# Patient Record
Sex: Female | Born: 1985 | Race: Black or African American | Hispanic: No
Health system: Southern US, Community
[De-identification: ages and names within clinical notes are randomized; demographics above are authoritative.]

## PROBLEM LIST (undated history)

## (undated) ENCOUNTER — Inpatient Hospital Stay (HOSPITAL_COMMUNITY): Payer: Self-pay

## (undated) DIAGNOSIS — J45909 Unspecified asthma, uncomplicated: Secondary | ICD-10-CM

## (undated) DIAGNOSIS — O24419 Gestational diabetes mellitus in pregnancy, unspecified control: Secondary | ICD-10-CM

## (undated) HISTORY — PX: BARTHOLIN GLAND CYST EXCISION: SHX565

---
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
## 2005-03-10 ENCOUNTER — Emergency Department (HOSPITAL_COMMUNITY): Admission: EM | Admit: 2005-03-10 | Discharge: 2005-03-10 | Payer: Self-pay | Admitting: Emergency Medicine

## 2005-07-23 ENCOUNTER — Emergency Department (HOSPITAL_COMMUNITY): Admission: EM | Admit: 2005-07-23 | Discharge: 2005-07-23 | Payer: Self-pay | Admitting: Emergency Medicine

## 2006-02-08 ENCOUNTER — Inpatient Hospital Stay (HOSPITAL_COMMUNITY): Admission: AD | Admit: 2006-02-08 | Discharge: 2006-02-08 | Payer: Self-pay | Admitting: Obstetrics & Gynecology

## 2006-04-15 ENCOUNTER — Inpatient Hospital Stay (HOSPITAL_COMMUNITY): Admission: AD | Admit: 2006-04-15 | Discharge: 2006-04-15 | Payer: Self-pay | Admitting: Obstetrics & Gynecology

## 2006-04-15 ENCOUNTER — Ambulatory Visit: Payer: Self-pay | Admitting: Obstetrics & Gynecology

## 2006-07-06 ENCOUNTER — Ambulatory Visit: Payer: Self-pay | Admitting: Obstetrics and Gynecology

## 2006-07-06 ENCOUNTER — Inpatient Hospital Stay (HOSPITAL_COMMUNITY): Admission: AD | Admit: 2006-07-06 | Discharge: 2006-07-06 | Payer: Self-pay | Admitting: Obstetrics and Gynecology

## 2006-07-19 ENCOUNTER — Inpatient Hospital Stay (HOSPITAL_COMMUNITY): Admission: AD | Admit: 2006-07-19 | Discharge: 2006-07-19 | Payer: Self-pay | Admitting: Obstetrics and Gynecology

## 2006-07-21 ENCOUNTER — Inpatient Hospital Stay (HOSPITAL_COMMUNITY): Admission: AD | Admit: 2006-07-21 | Discharge: 2006-07-24 | Payer: Self-pay | Admitting: Obstetrics and Gynecology

## 2006-07-25 ENCOUNTER — Inpatient Hospital Stay (HOSPITAL_COMMUNITY): Admission: AD | Admit: 2006-07-25 | Discharge: 2006-07-25 | Payer: Self-pay | Admitting: Obstetrics and Gynecology

## 2006-09-23 ENCOUNTER — Inpatient Hospital Stay (HOSPITAL_COMMUNITY): Admission: EM | Admit: 2006-09-23 | Discharge: 2006-09-27 | Payer: Self-pay | Admitting: Emergency Medicine

## 2007-10-30 ENCOUNTER — Emergency Department (HOSPITAL_COMMUNITY): Admission: EM | Admit: 2007-10-30 | Discharge: 2007-10-31 | Payer: Self-pay | Admitting: Emergency Medicine

## 2007-11-19 ENCOUNTER — Emergency Department (HOSPITAL_COMMUNITY): Admission: EM | Admit: 2007-11-19 | Discharge: 2007-11-19 | Payer: Self-pay | Admitting: Emergency Medicine

## 2008-06-08 ENCOUNTER — Ambulatory Visit: Payer: Self-pay | Admitting: Physician Assistant

## 2008-06-08 ENCOUNTER — Inpatient Hospital Stay (HOSPITAL_COMMUNITY): Admission: AD | Admit: 2008-06-08 | Discharge: 2008-06-09 | Payer: Self-pay | Admitting: Obstetrics & Gynecology

## 2008-11-02 ENCOUNTER — Inpatient Hospital Stay (HOSPITAL_COMMUNITY): Admission: AD | Admit: 2008-11-02 | Discharge: 2008-11-02 | Payer: Self-pay | Admitting: Obstetrics and Gynecology

## 2009-01-01 ENCOUNTER — Inpatient Hospital Stay (HOSPITAL_COMMUNITY): Admission: AD | Admit: 2009-01-01 | Discharge: 2009-01-01 | Payer: Self-pay | Admitting: Obstetrics and Gynecology

## 2009-01-28 ENCOUNTER — Inpatient Hospital Stay (HOSPITAL_COMMUNITY): Admission: AD | Admit: 2009-01-28 | Discharge: 2009-01-28 | Payer: Self-pay | Admitting: Obstetrics and Gynecology

## 2009-01-29 ENCOUNTER — Inpatient Hospital Stay (HOSPITAL_COMMUNITY): Admission: AD | Admit: 2009-01-29 | Discharge: 2009-02-01 | Payer: Self-pay | Admitting: Obstetrics and Gynecology

## 2009-02-02 ENCOUNTER — Encounter: Admission: RE | Admit: 2009-02-02 | Discharge: 2009-02-06 | Payer: Self-pay | Admitting: Obstetrics and Gynecology

## 2010-02-05 ENCOUNTER — Emergency Department (HOSPITAL_COMMUNITY)
Admission: EM | Admit: 2010-02-05 | Discharge: 2010-02-05 | Payer: Self-pay | Source: Home / Self Care | Admitting: Emergency Medicine

## 2010-02-05 LAB — CBC
HCT: 42.3 % (ref 36.0–46.0)
Hemoglobin: 14.5 g/dL (ref 12.0–15.0)
MCV: 88.3 fL (ref 78.0–100.0)
RBC: 4.79 MIL/uL (ref 3.87–5.11)
WBC: 8.6 10*3/uL (ref 4.0–10.5)

## 2010-02-05 LAB — DIFFERENTIAL
Eosinophils Absolute: 0.1 10*3/uL (ref 0.0–0.7)
Eosinophils Relative: 1 % (ref 0–5)
Lymphs Abs: 0.5 10*3/uL — ABNORMAL LOW (ref 0.7–4.0)
Monocytes Absolute: 0.5 10*3/uL (ref 0.1–1.0)

## 2010-02-05 LAB — URINALYSIS, ROUTINE W REFLEX MICROSCOPIC
Hgb urine dipstick: NEGATIVE
Ketones, ur: 15 mg/dL — AB
Nitrite: NEGATIVE
Specific Gravity, Urine: 1.034 — ABNORMAL HIGH (ref 1.005–1.030)
Urine Glucose, Fasting: NEGATIVE mg/dL
pH: 5.5 (ref 5.0–8.0)

## 2010-02-05 LAB — POCT I-STAT, CHEM 8
Calcium, Ion: 1.14 mmol/L (ref 1.12–1.32)
Chloride: 105 mEq/L (ref 96–112)
Hemoglobin: 16 g/dL — ABNORMAL HIGH (ref 12.0–15.0)
TCO2: 25 mmol/L (ref 0–100)

## 2010-02-05 LAB — PREGNANCY, URINE: Preg Test, Ur: NEGATIVE

## 2010-03-26 LAB — CBC
HCT: 30.9 % — ABNORMAL LOW (ref 36.0–46.0)
Hemoglobin: 11.9 g/dL — ABNORMAL LOW (ref 12.0–15.0)
MCHC: 33.7 g/dL (ref 30.0–36.0)
MCV: 93.7 fL (ref 78.0–100.0)
Platelets: 160 10*3/uL (ref 150–400)
Platelets: 180 10*3/uL (ref 150–400)
RBC: 3.31 MIL/uL — ABNORMAL LOW (ref 3.87–5.11)
RDW: 13.6 % (ref 11.5–15.5)
WBC: 8.8 10*3/uL (ref 4.0–10.5)

## 2010-03-26 LAB — GLUCOSE, CAPILLARY
Glucose-Capillary: 102 mg/dL — ABNORMAL HIGH (ref 70–99)
Glucose-Capillary: 86 mg/dL (ref 70–99)

## 2010-04-10 LAB — URINALYSIS, ROUTINE W REFLEX MICROSCOPIC
Glucose, UA: NEGATIVE mg/dL
Hgb urine dipstick: NEGATIVE
Nitrite: NEGATIVE
Protein, ur: NEGATIVE mg/dL
Specific Gravity, Urine: 1.02 (ref 1.005–1.030)

## 2010-04-17 LAB — URINALYSIS, ROUTINE W REFLEX MICROSCOPIC
Bilirubin Urine: NEGATIVE
Hgb urine dipstick: NEGATIVE
Protein, ur: NEGATIVE mg/dL
Specific Gravity, Urine: >1.03 — ABNORMAL HIGH (ref 1.005–1.030)
Urobilinogen, UA: 0.2 mg/dL (ref 0.0–1.0)
pH: 6 (ref 5.0–8.0)

## 2010-04-17 LAB — GC/CHLAMYDIA PROBE AMP, GENITAL: GC Probe Amp, Genital: NEGATIVE

## 2010-04-17 LAB — WET PREP, GENITAL

## 2010-05-23 NOTE — H&P (Signed)
[name], [name]              ACCOUNT NO.:  [id number]   MEDICAL RECORD NO.:  [id number]          PATIENT TYPE:  INP   LOCATION:  0112                         FACILITY:  [facility]   PHYSICIAN:  Mobolaji B. Bakare, M.D.DATE OF BIRTH:  June 20, 1985   DATE OF ADMISSION:  09/23/2006  DATE OF DISCHARGE:                              HISTORY & PHYSICAL   PRIMARY CARE PHYSICIAN:  Unassigned.   CHIEF COMPLAINT:  Fever for 3 days.   HISTORY OF PRESENTING COMPLAINT:  [name] is a 25 year old Caucasian  female who was in her usual state of health until about 3 days ago, when  she developed headaches, which was subsequently followed by fever; her  temperature was 102 at home.  She started vomiting 2 days ago and had  diarrhea about 7 times yesterday.  She has also noticed constant left  flank pain.  She denies dysuria or increased frequency of micturition.  Urine is dark, no particular odor to it.   She denies recent travels.  She has dogs and cats at home.  There is no  history of contact with any sick person at home.   REVIEW OF SYSTEMS:  The patient has lost her appetite in the last couple  of days.  There is no shortness of breath, cough or chest pain.  She  recently delivered in July 2008, but she is now breast-feeding and no  breast pain.  There has been no change in her level of consciousness.  Headache has improved with Tylenol.   PAST MEDICAL HISTORY:  1. History of asthma.  2. Chlamydia.  3. PID.  4. History of anemia.   PAST SURGICAL HISTORY:  Incision and drainage of Bartholin's cyst.   OBSTETRICAL/GYNECOLOGICAL HISTORY:  The patient is para 1; recent  delivery was in July 2008.  She has had multiple miscarriages.   MEDICATIONS:  None.   ALLERGIES:  No known drug allergies.   FAMILY HISTORY:  Significant for diabetes mellitus and hypertension.   SOCIAL HISTORY:  She does not drink alcohol, denies drug abuse.  She  smokes cigarettes.   PHYSICAL EXAMINATION:   INITIAL VITALS:  Temperature 102.6, blood  pressure 80/60, pulse of 141, respiratory rate of 16, O2 saturations of  99%.  Repeat blood pressure 105/73, pulse of 106.  GENERAL:  The patient is alert and oriented in time, place and person.  HEENT:  Normocephalic, atraumatic head.  Pupils are equal, round and  reactive to light.  Extraocular muscle movement intact.  She is not  dyspneic.  LUNGS:  Clear clinically to auscultation.  CARDIOVASCULAR:  S1 and S2.  Regular.  No murmur or gallop.  ABDOMEN:  Not distended.  Soft.  She has epigastric and bilateral renal  angle tenderness.  No palpable organomegaly.  There is no rebound or  guarding.  There are no signs to suggest acute abdomen.  Bowel sounds  present.  Specifically, McBurney's point is not tender.  EXTREMITIES:  No pedal edema or calf tenderness.  Dorsalis pedis pulses  are palpable bilaterally.  CNS:  No focal neurologic deficits.   INITIAL LABORATORY DATA:  Urine microscopy:  White cells 7-10, red blood  cells too numerous to count, bacteria many, appearance is turbid,  specific gravity 1.018, blood is large, protein 100, nitrite negative,  leukocytes small.  Sodium 133, potassium 3.7, chloride 100, bicarb 25,  glucose 129, BUN 34, creatinine 1.86, calcium 8.6.  White cell count  20.5, hemoglobin 12.7, hematocrit 36.8, platelets 202,000, neutrophils  90%.  Pregnancy test negative.  Blood culture has been done.   ASSESSMENT AND PLAN:  [name] is a 25 year old Caucasian female  presenting with fever, headaches, nausea, vomiting and transient  diarrhea associated with bilateral flank pain.  She has bilateral renal  angle tenderness.  Urinalysis is supportive of urinary tract infection.  She does not have dysuria or urgency.  The patient was hypotensive in  the emergency room, but she has responded to IV fluid.   ADMISSION DIAGNOSES:  1. Acute pyelonephritis.  Urine culture and blood cultures have been      sent.  We will  start empiric treatment with ciprofloxacin 400 mg      q.12 h. until culture report is available.  Tylenol 650 mg q.4 h.      p.r.n. for fever.  2. Nausea, vomiting and diarrhea with abdominal pain, probably related      to acute pyelonephritis versus acute gastroenteritis.  The      patient's abdomen is soft.  There is no sign of acute abdomen.  She      has acute renal insufficiency which precludes obtaining a CT scan      at this point.  We will hydrate the patient aggressively and      reevaluate.  If there is no improvement in abdominal symptoms, we      will consider obtaining a CT scan to rule out acute colitis.  We      will treat symptomatically with intravenous fluid, Phenergan 12.5      mg intravenously q.4 h. p.r.n.  We will send stool for culture,      fecal leukocytes and Clostridium difficile, should she continue to      have diarrhea.  We will give Dilaudid 0.5 mg to 1 mg intravenously      q.4 h. p.r.n. for severe pain and oxycodone 5 mg p.o. q.4 h. p.r.n.      for mild to moderate pain.  3. Hypotension secondary to #1 and #2.  Blood pressure is improving      with intravenous fluid.  We will continue intravenous fluid of      normal saline at 200 mL/hr for 1 L, then 125 mL per hour.  4. Acute renal insufficiency.  The patient's baseline BUN and      creatinine are unknown; however, I suspect this is likely prerenal.      We will aggressively hydrate and recheck BMET in the morning.  5. Tobacco abuse.  We will offer tobacco cessation counseling and      nicotine patch.      Mobolaji B. Corky Downs, M.D.  Electronically Signed     MBB/MEDQ  D:  09/23/2006  T:  09/23/2006  Job:  [id number]

## 2010-05-23 NOTE — Discharge Summary (Signed)
NAMEHONORE, WIPPERFURTH NO.:  [id number]   MEDICAL RECORD NO.:  [id number]          PATIENT TYPE:  INP   LOCATION:  9133                          FACILITY:  [facility]   PHYSICIAN:  Malachi Pro. Ambrose Mantle, M.D. DATE OF BIRTH:  07-27-1985   DATE OF ADMISSION:  07/21/2006  DATE OF DISCHARGE:  07/24/2006                               DISCHARGE SUMMARY   A 25 year old black single female para 0-0-1-0 gravida 2, EDC July 29, 2006 admitted with rupture of membranes at approximately 6:00 p.m.  Blood group and type A+, negative antibody, sickle cell negative, RPR  nonreactive, rubella immune, hepatitis B surface antigen negative, HIV  negative.  1-hour Glucola 143.  3-hour GTT 78, 182, 156 and 140.  Prenatal care began on March 01, 2006. She had one visit in  [place], then moved to [facility] in [place]. She was  seen there for seven visits through July 01, 2006, ultrasound on March 28, 2006 showed an average gestational age of [redacted] weeks and 1 day. The  patient received RhoGAM at 28 weeks but is A positive. Ultrasound here  on April 15, 2006 showed average gestational age [redacted] weeks 1 day, [facility] July 28, 2006. She returned here on July 09, 2006 and was treated with  Macrobid for UTI. At 6:00 p.m. on July 21, 2006 she had spontaneous  rupture of membranes at home with clear fluid.  She began contracting by  about 6:30 p.m.   PAST MEDICAL HISTORY:  No known allergies.  Operations; I&D of a  Bartholin's cyst. Illnesses; chickenpox as a child and asthma.   FAMILY HISTORY:  Negative.   PHYSICAL EXAM ON ADMISSION:  VITAL SIGNS:  Showed normal vital signs.  HEART/LUNGS: Were normal.  ABDOMEN:  Soft, fundal height 37 cm. On July 15, 2006 fetal heart tones  were normal.  Contractions every 2-5 minutes. No decelerations.  PELVIC:  Cervix was far posterior, fingertip, 40-50%, vertex at a -2 to  -3.   HOSPITAL COURSE:  She was begun on penicillin and Pitocin. At 12:42 a.m.  the Pitocin was at 12 milliunits a minute.  Contractions every 2-4  minutes.  The patient was writhing in pain.  Fetal heart tones were  normal.  Cervix was 2+ centimeters, 80%, vertex at a minus one station  and because of some progressive dilatation effacement and descent  epidural was given. The patient received her epidural.  Progressed to  complete dilatation.  She pushed well and delivered spontaneously OA  over an intact perineum a living female infant 7 pounds 4 ounces, Apgars  of 8 at 1 and 9 at 5 minutes.  Placenta was intact.  Uterus normal.  Small left upper labial laceration was hemostatic and not repaired.  Blood loss about 300 mL.  Postpartum the patient did well and was  discharged on the second postpartum day.  RPR was nonreactive.  Hemoglobin was 11.1 on admission, and follow-up not recorded in the  chart. Her vital signs have been normal.  There is been no heavy  bleeding.   FINAL DIAGNOSES:  Intrauterine pregnancy  for 39 weeks, positive group B  strep, premature rupture of membranes, delivery OA.   OPERATION:  Spontaneous delivery OA.   FINAL CONDITION:  Improved.  Instructions include our regular discharge  instruction booklet.  The patient requests and received prescription for  Percocet 5/325 24 tablets one every 4-6 hours as needed for pain and is  advised to return to the office in 6 weeks for follow-up examination.      Malachi Pro. Ambrose Mantle, M.D.  Electronically Signed     TFH/MEDQ  D:  07/24/2006  T:  07/24/2006  Job:  [id number]

## 2010-05-24 IMAGING — US US OB COMP LESS 14 WK
1 series · 14 of 28 positions shown · non-contrast
Comparison: None

06/09/2008 - DUPLICATE COPY for exam association in RIS and CORRECTED TITLE/TECHNIQUE FOR EXAM:
 OBSTETRICAL ULTRASOUND <14 WKS AND TRANSVAGINAL OB US:
TECHNIQUE: Both transabdominal and transvaginal ultrasound examinations were performed for complete evaluation of the gestation as well as the maternal uterus, adnexal regions, and pelvic cul-de-sac.
CLINICAL DATA: Pelvic pain

 OBSTETRIC <14 WK ULTRASOUND
TECHNIQUE: Transabdominal ultrasound was performed for evaluation
 of the gestation as well as the maternal uterus and adnexal
 regions.

[Series 1: us ob comp less 14 wk · 0.27mm/px · 14 of 39 slices shown]
[im 2/39]
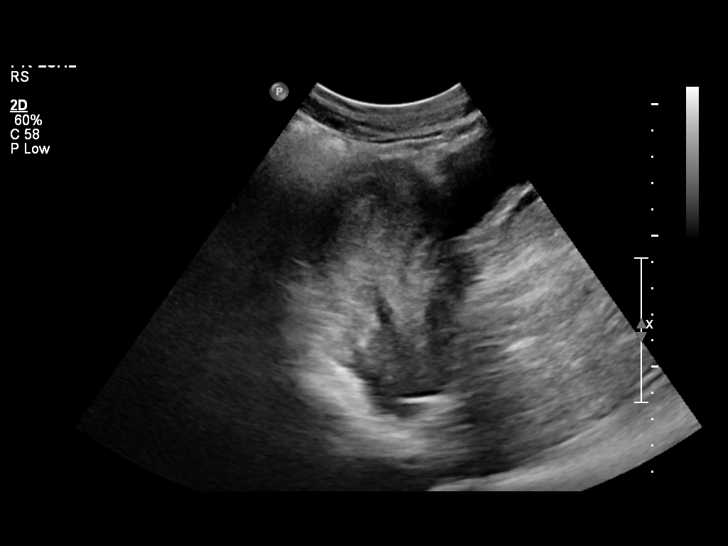
[im 5/39]
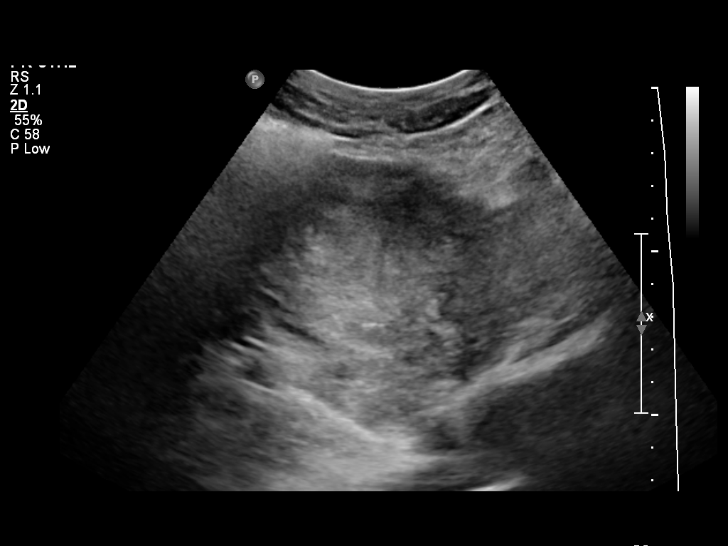
[im 8/39]
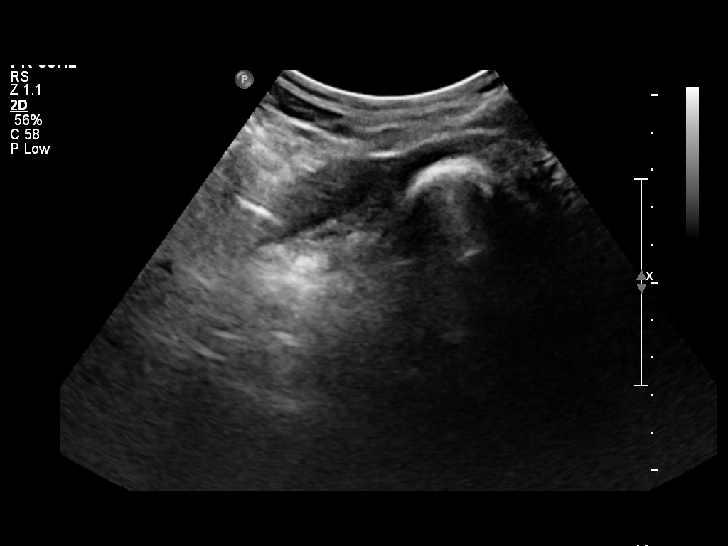
[im 10/39]
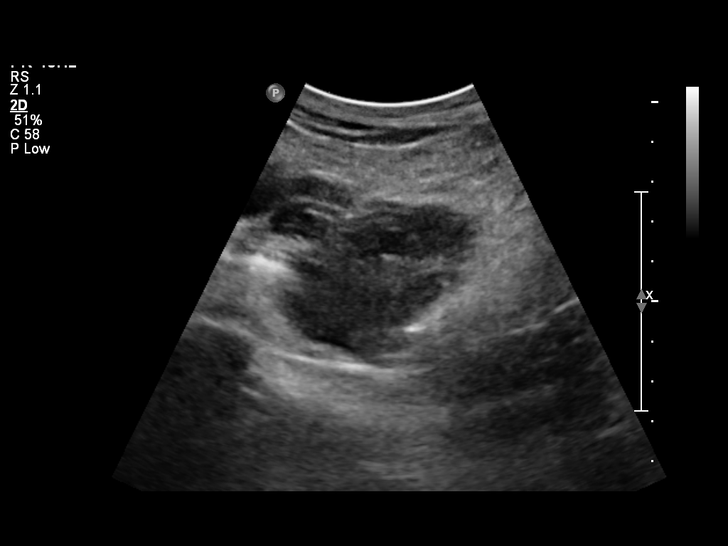
[im 13/39]
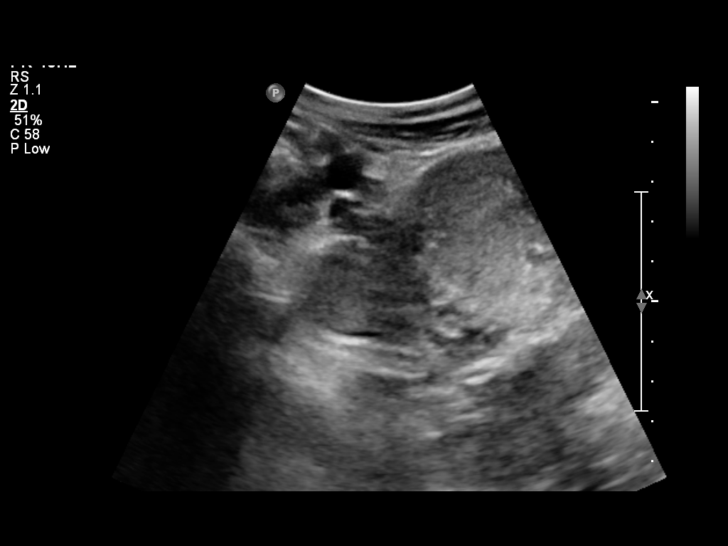
[im 16/39]
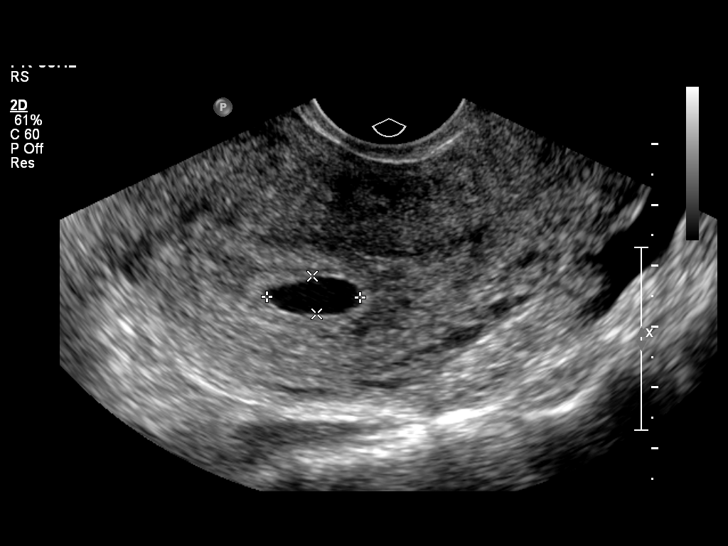
[im 19/39]
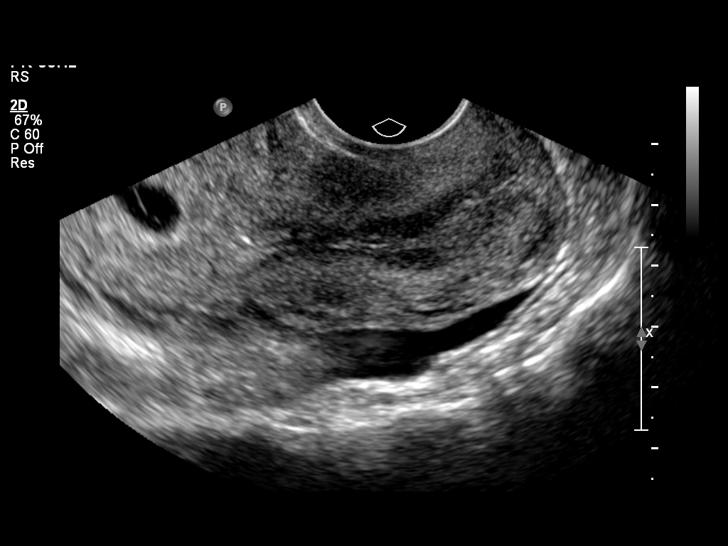
[im 22/39]
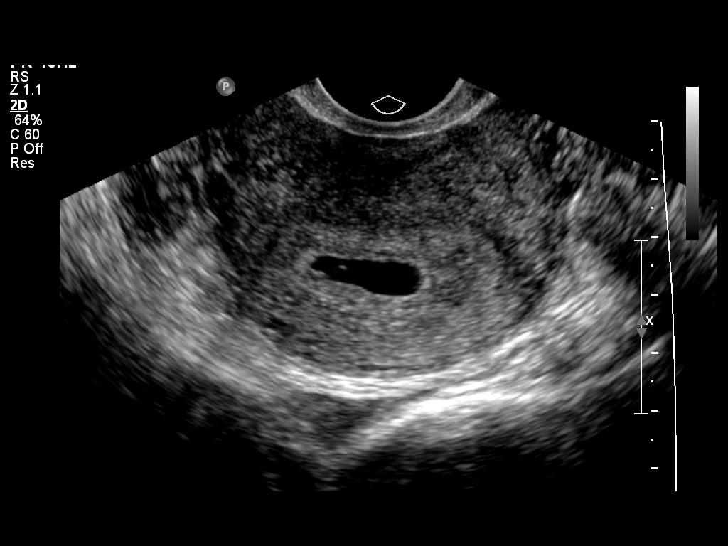
[im 24/39]
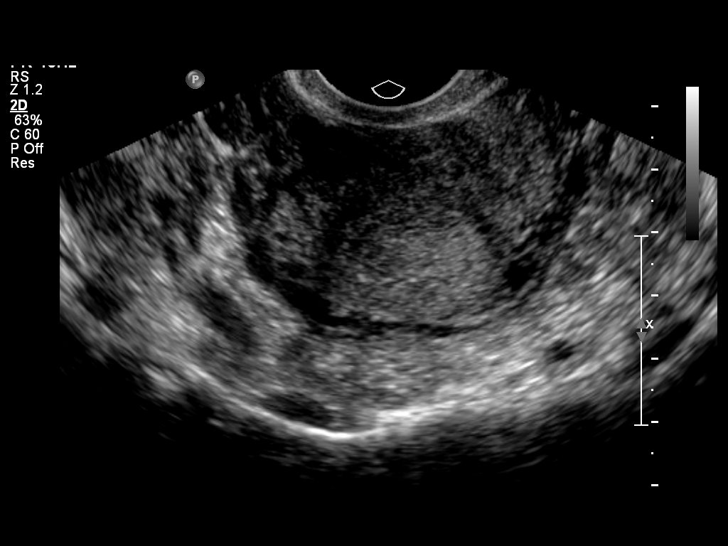
[im 27/39]
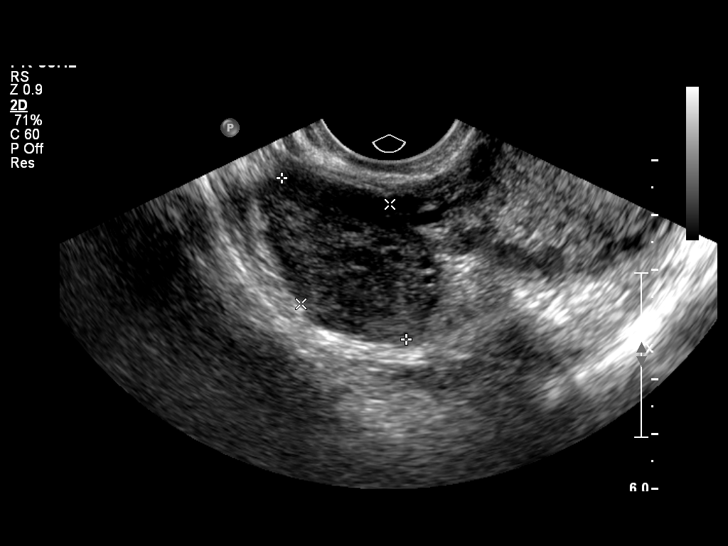
[im 30/39]
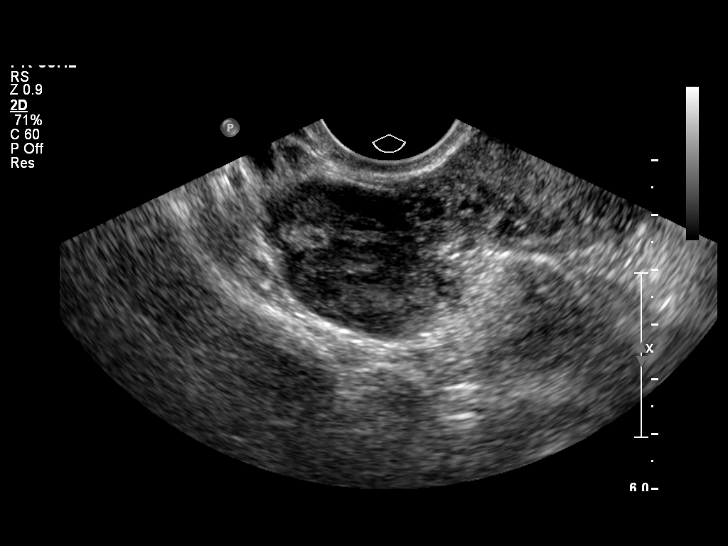
[im 33/39]
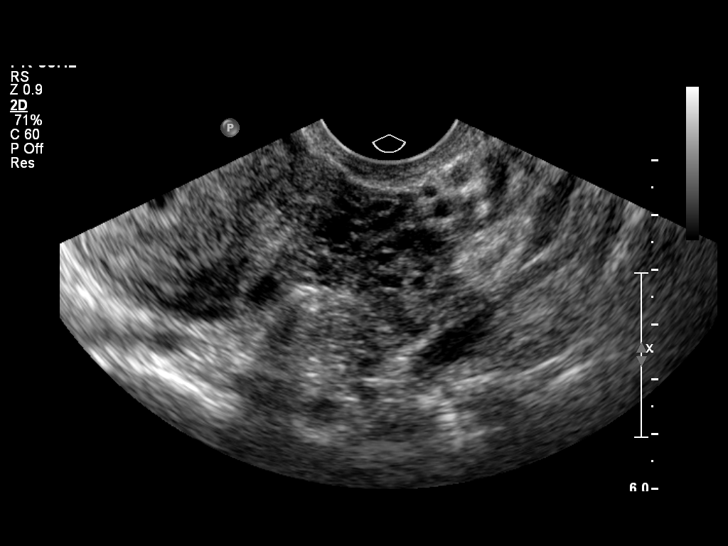
[im 36/39]
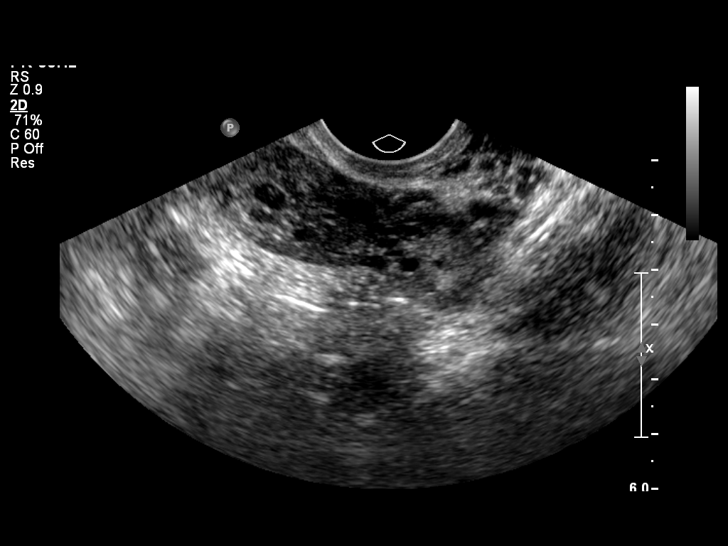
[im 39/39]
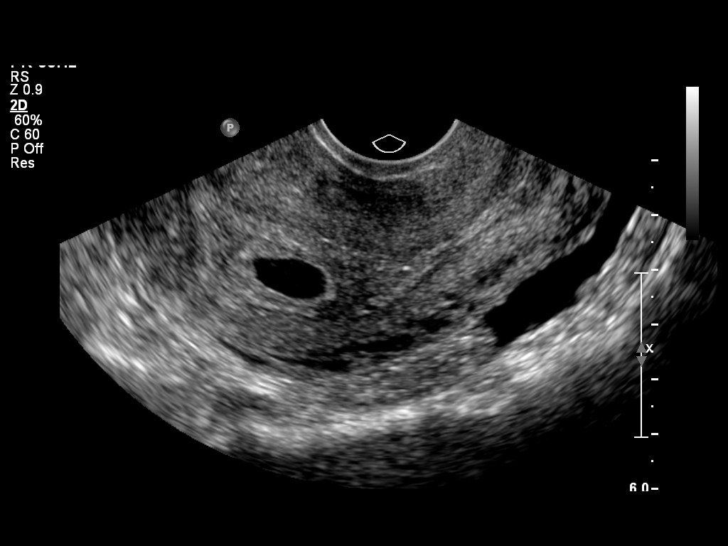

[14 of 28 positions shown; findings below may reference images not displayed]

Intrauterine gestational sac: Single
 Yolk sac: Yes
 Embryo: None
 Cardiac Activity: None
 Heart Rate: bpm

 MSD: 1.35 mm 6w 1d
 CRL: w d US EDC: 02/01/2009

 Maternal uterus/Adnexae:
 The ovaries are normal bilaterally. No free fluid.
IMPRESSION: A single gestational sac is identified. This corresponds to an
 estimated gestational age of 6 weeks 1 day. No fetal pole is
 identified.

## 2010-05-26 NOTE — Discharge Summary (Signed)
Carmen Mcgee, Carmen Mcgee NO.:  [id number]   MEDICAL RECORD NO.:  [id number]          PATIENT TYPE:  INP   LOCATION:  1316                         FACILITY[place]  [facility] [place]   PHYSICIAN:  Michaelyn Barter, M.D. DATE OF BIRTH:  04/18/1985   DATE OF ADMISSION:  09/23/2006  DATE OF DISCHARGE:  09/27/2006                               DISCHARGE SUMMARY   PRIMARY CARE PHYSICIAN:  The patient is unassigned.   FINAL DIAGNOSES:  1. Acute pyelonephritis.  2. Anemia.   PROCEDURES:  Portable chest x-ray completed September 24, 2006.   HISTORY OF PRESENT ILLNESS:  Carmen Mcgee is a 25 year old female who  complained of developing a headache followed by fever of 102.  She then  went on to develop emesis as well as diarrhea.  Later she began to have  left-sided flank pain.   PAST MEDICAL HISTORY:  Please see that dictated by Dr. Jamison Oka.   HOSPITAL COURSE:  Problem 1.  Acute pyelonephritis.  The patient had a  urinalysis completed which revealed 7-10 white blood cells, RBCs were  too numerous to count, and many bacteria were present.  She was started  empirically on ciprofloxacin 400 mg IV q.12h.  Her urine culture was  positive for E coli, although only 7000 colonies were noted to be  present.  The organism was found to be sensitive to ciprofloxacin.  Over  the course of hospitalization, the patient's fevers resolved.  Her pain  improved, and she began to request to be discharged home.   Problem 2.  Acute renal insufficiency.  The patient's BUN was noted to  have been 34 with a creatinine of 1.86 at the time of admission.  This  may have been secondary to volume depletion and/or dehydration, so  gentle IV fluid hydration was provided to the patient.  By the day of  discharge, her renal insufficiency resolved with a BUN of 6 and a  creatinine of 0.93 by September 25, 2006.   Problem 3.  E coli bacteremia.  Blood cultures were ordered on the  patient on September 23, 2006.   They were later identified on September 27, 2006 to be positive for E coli.   Problem 4.  Anemia.  The patient did have an iron level of 31 with a  TIBC of 157.  Therefore, there is an element of iron deficiency anemia  present.  Stools were checked for blood and were found to be negative on  September 26, 2006.   By the day of discharge, the patient indicated that she felt  significantly better and she requested to be discharged home.  Her  temperature was 99.3, heart rate 73, respirations 18, blood pressure  130/86, O2 sat 95% on room air.  The patient was discharged home.   DISCHARGE MEDICATIONS:  1. Ciprofloxacin 250 mg p.o. q.12h.  2. Zofran 4 mg p.o. q.8h. p.r.n.  3. Oxycodone 5 mg p.o. q.6h. p.r.n.   She was told to take her medications as prescribed, to drink plenty of  fluids to prevent dehydration, and to follow up with her primary care  doctor within the next 2-4 weeks.      Michaelyn Barter, M.D.  Electronically Signed     OR/MEDQ  D:  10/14/2006  T:  10/15/2006  Job:  [id number]

## 2010-10-09 LAB — URINALYSIS, ROUTINE W REFLEX MICROSCOPIC
Glucose, UA: NEGATIVE
Ketones, ur: 40 — AB
Specific Gravity, Urine: 1.024

## 2010-10-09 LAB — DIFFERENTIAL
Basophils Relative: 0
Monocytes Absolute: 0.6
Monocytes Relative: 5
Neutro Abs: 11.6 — ABNORMAL HIGH

## 2010-10-09 LAB — POCT I-STAT, CHEM 8
BUN: 11
Calcium, Ion: 1.04 — ABNORMAL LOW
Chloride: 113 — ABNORMAL HIGH
Creatinine, Ser: 0.9
Glucose, Bld: 84
HCT: 42
Hemoglobin: 14.3
Potassium: 5.9 — ABNORMAL HIGH
Sodium: 139
TCO2: 21

## 2010-10-09 LAB — POTASSIUM: Potassium: 5.8 — ABNORMAL HIGH

## 2010-10-09 LAB — CBC
Hemoglobin: 13.2
MCHC: 33
RBC: 4.37

## 2010-10-09 LAB — URINE MICROSCOPIC-ADD ON

## 2010-10-10 LAB — COMPREHENSIVE METABOLIC PANEL
ALT: 17
AST: 23
Albumin: 4
CO2: 23
Calcium: 8.8
Chloride: 108
GFR calc Af Amer: >60
GFR calc non Af Amer: >60
Sodium: 141

## 2010-10-10 LAB — DIFFERENTIAL
Eosinophils Absolute: 0.5
Eosinophils Relative: 4
Lymphocytes Relative: 15
Lymphs Abs: 1.7
Monocytes Absolute: 0.5

## 2010-10-10 LAB — CBC
Platelets: 348
RBC: 4.53
WBC: 11 — ABNORMAL HIGH

## 2010-10-10 LAB — GLUCOSE, CAPILLARY: Glucose-Capillary: 108 — ABNORMAL HIGH

## 2010-10-10 LAB — RAPID URINE DRUG SCREEN, HOSP PERFORMED: Barbiturates: NOT DETECTED

## 2010-10-10 LAB — URINE MICROSCOPIC-ADD ON

## 2010-10-10 LAB — POCT PREGNANCY, URINE
Preg Test, Ur: NEGATIVE
Preg Test, Ur: NEGATIVE

## 2010-10-10 LAB — URINALYSIS, ROUTINE W REFLEX MICROSCOPIC
Glucose, UA: NEGATIVE
Hgb urine dipstick: NEGATIVE
Specific Gravity, Urine: 1.006
pH: 6

## 2010-10-10 LAB — URINE CULTURE: Colony Count: >=100000

## 2010-10-19 LAB — URINALYSIS, ROUTINE W REFLEX MICROSCOPIC
Glucose, UA: NEGATIVE
Ketones, ur: NEGATIVE
Nitrite: NEGATIVE
Nitrite: NEGATIVE
Protein, ur: 100 — AB
Specific Gravity, Urine: 1.018
Urobilinogen, UA: 0.2
Urobilinogen, UA: 1

## 2010-10-19 LAB — URINE CULTURE

## 2010-10-19 LAB — COMPREHENSIVE METABOLIC PANEL
ALT: 28
Albumin: 2.3 — ABNORMAL LOW
Calcium: 8.5
GFR calc Af Amer: >60
Glucose, Bld: 90
Sodium: 138
Total Protein: 5.8 — ABNORMAL LOW

## 2010-10-19 LAB — DIFFERENTIAL
Basophils Relative: 0
Eosinophils Absolute: 0
Eosinophils Absolute: 0
Lymphocytes Relative: 5 — ABNORMAL LOW
Lymphs Abs: 1
Lymphs Abs: 1
Monocytes Relative: 12 — ABNORMAL HIGH
Neutro Abs: 18.5 — ABNORMAL HIGH
Neutro Abs: 7.5
Neutrophils Relative %: 78 — ABNORMAL HIGH

## 2010-10-19 LAB — BASIC METABOLIC PANEL
BUN: 27 — ABNORMAL HIGH
CO2: 19
CO2: 24
CO2: 25
Calcium: 8 — ABNORMAL LOW
Calcium: 8.6
Chloride: 100
Creatinine, Ser: 1.2
GFR calc Af Amer: 41 — ABNORMAL LOW
Glucose, Bld: 129 — ABNORMAL HIGH
Glucose, Bld: 92
Glucose, Bld: 92
Potassium: 3.5
Potassium: 3.7
Sodium: 133 — ABNORMAL LOW
Sodium: 136

## 2010-10-19 LAB — CBC
HCT: 28.8 — ABNORMAL LOW
HCT: 36.8
Hemoglobin: 10.8 — ABNORMAL LOW
Hemoglobin: 12.7
Hemoglobin: 9.9 — ABNORMAL LOW
MCHC: 34.3
MCHC: 34.4
MCHC: 34.4
MCHC: 34.4
Platelets: 139 — ABNORMAL LOW
RBC: 4.15
RDW: 14.5 — ABNORMAL HIGH
RDW: 14.5 — ABNORMAL HIGH
RDW: 14.7 — ABNORMAL HIGH
RDW: 14.9 — ABNORMAL HIGH

## 2010-10-19 LAB — LIPASE, BLOOD: Lipase: 13

## 2010-10-19 LAB — CULTURE, BLOOD (ROUTINE X 2): Culture: NO GROWTH

## 2010-10-19 LAB — LACTIC ACID, PLASMA: Lactic Acid, Venous: 1.5

## 2010-10-19 LAB — URINE MICROSCOPIC-ADD ON

## 2010-10-19 LAB — IRON AND TIBC
Iron: 31 — ABNORMAL LOW
TIBC: 157 — ABNORMAL LOW

## 2010-10-23 LAB — URINALYSIS, ROUTINE W REFLEX MICROSCOPIC
Bilirubin Urine: NEGATIVE
Glucose, UA: NEGATIVE
Ketones, ur: NEGATIVE
pH: 7

## 2010-10-23 LAB — URINE MICROSCOPIC-ADD ON

## 2010-10-23 LAB — CBC
MCHC: 33.6
Platelets: 259
RBC: 4.05
WBC: 13.3 — ABNORMAL HIGH

## 2010-10-24 LAB — CBC
MCV: 90.5
RBC: 3.66 — ABNORMAL LOW
WBC: 10.2

## 2010-10-24 LAB — RPR: RPR Ser Ql: NONREACTIVE

## 2010-10-25 LAB — URINALYSIS, ROUTINE W REFLEX MICROSCOPIC
Bilirubin Urine: NEGATIVE
Nitrite: NEGATIVE
Specific Gravity, Urine: 1.01
pH: 7.5

## 2010-10-25 LAB — URINE MICROSCOPIC-ADD ON

## 2012-01-20 IMAGING — CR DG CHEST 2V
2 series · 2 of 2 positions shown · non-contrast
Comparison: 09/24/2006

CLINICAL DATA: Back pain.  Nausea vomiting.  Hypotension.

CHEST - 2 VIEW

[w chest pa]
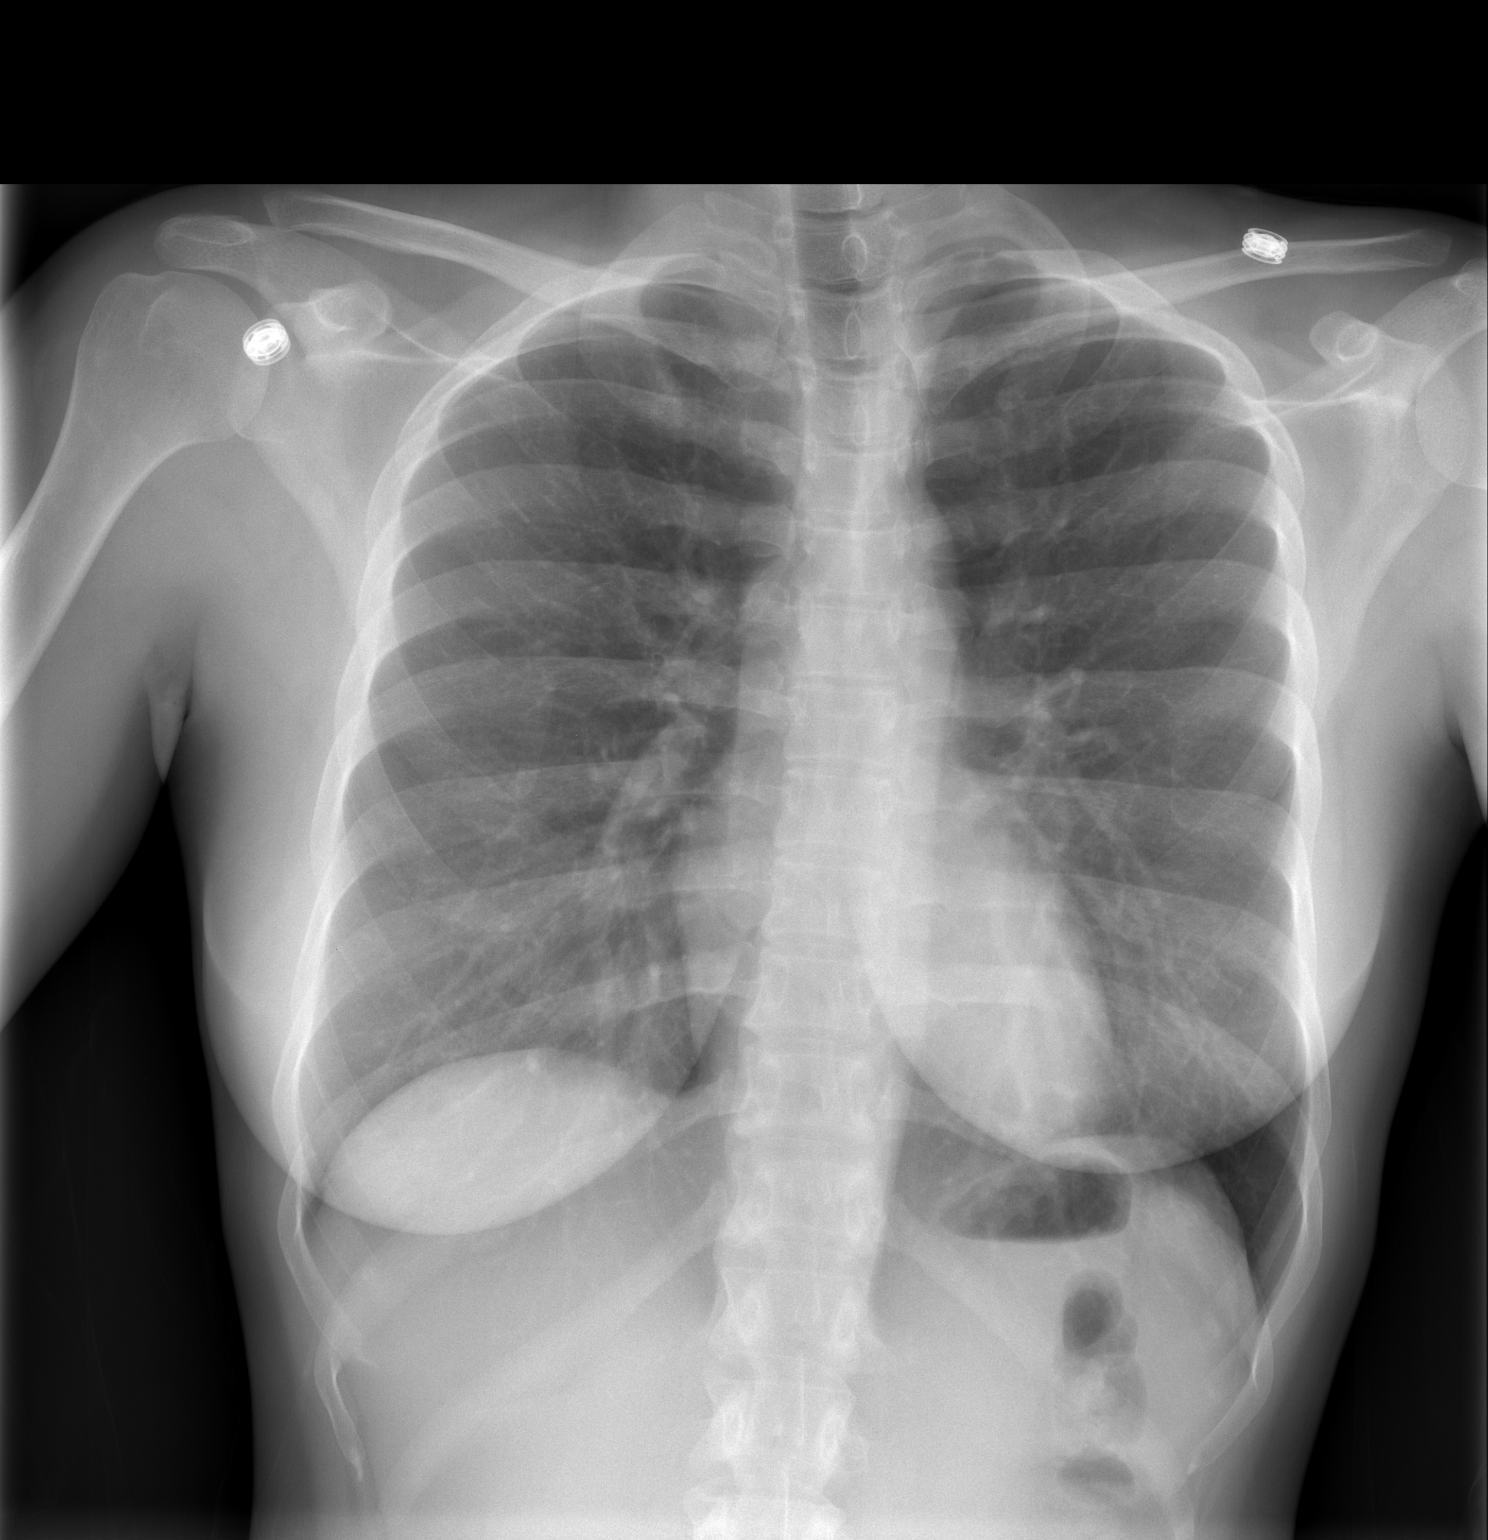

[w chest lat]
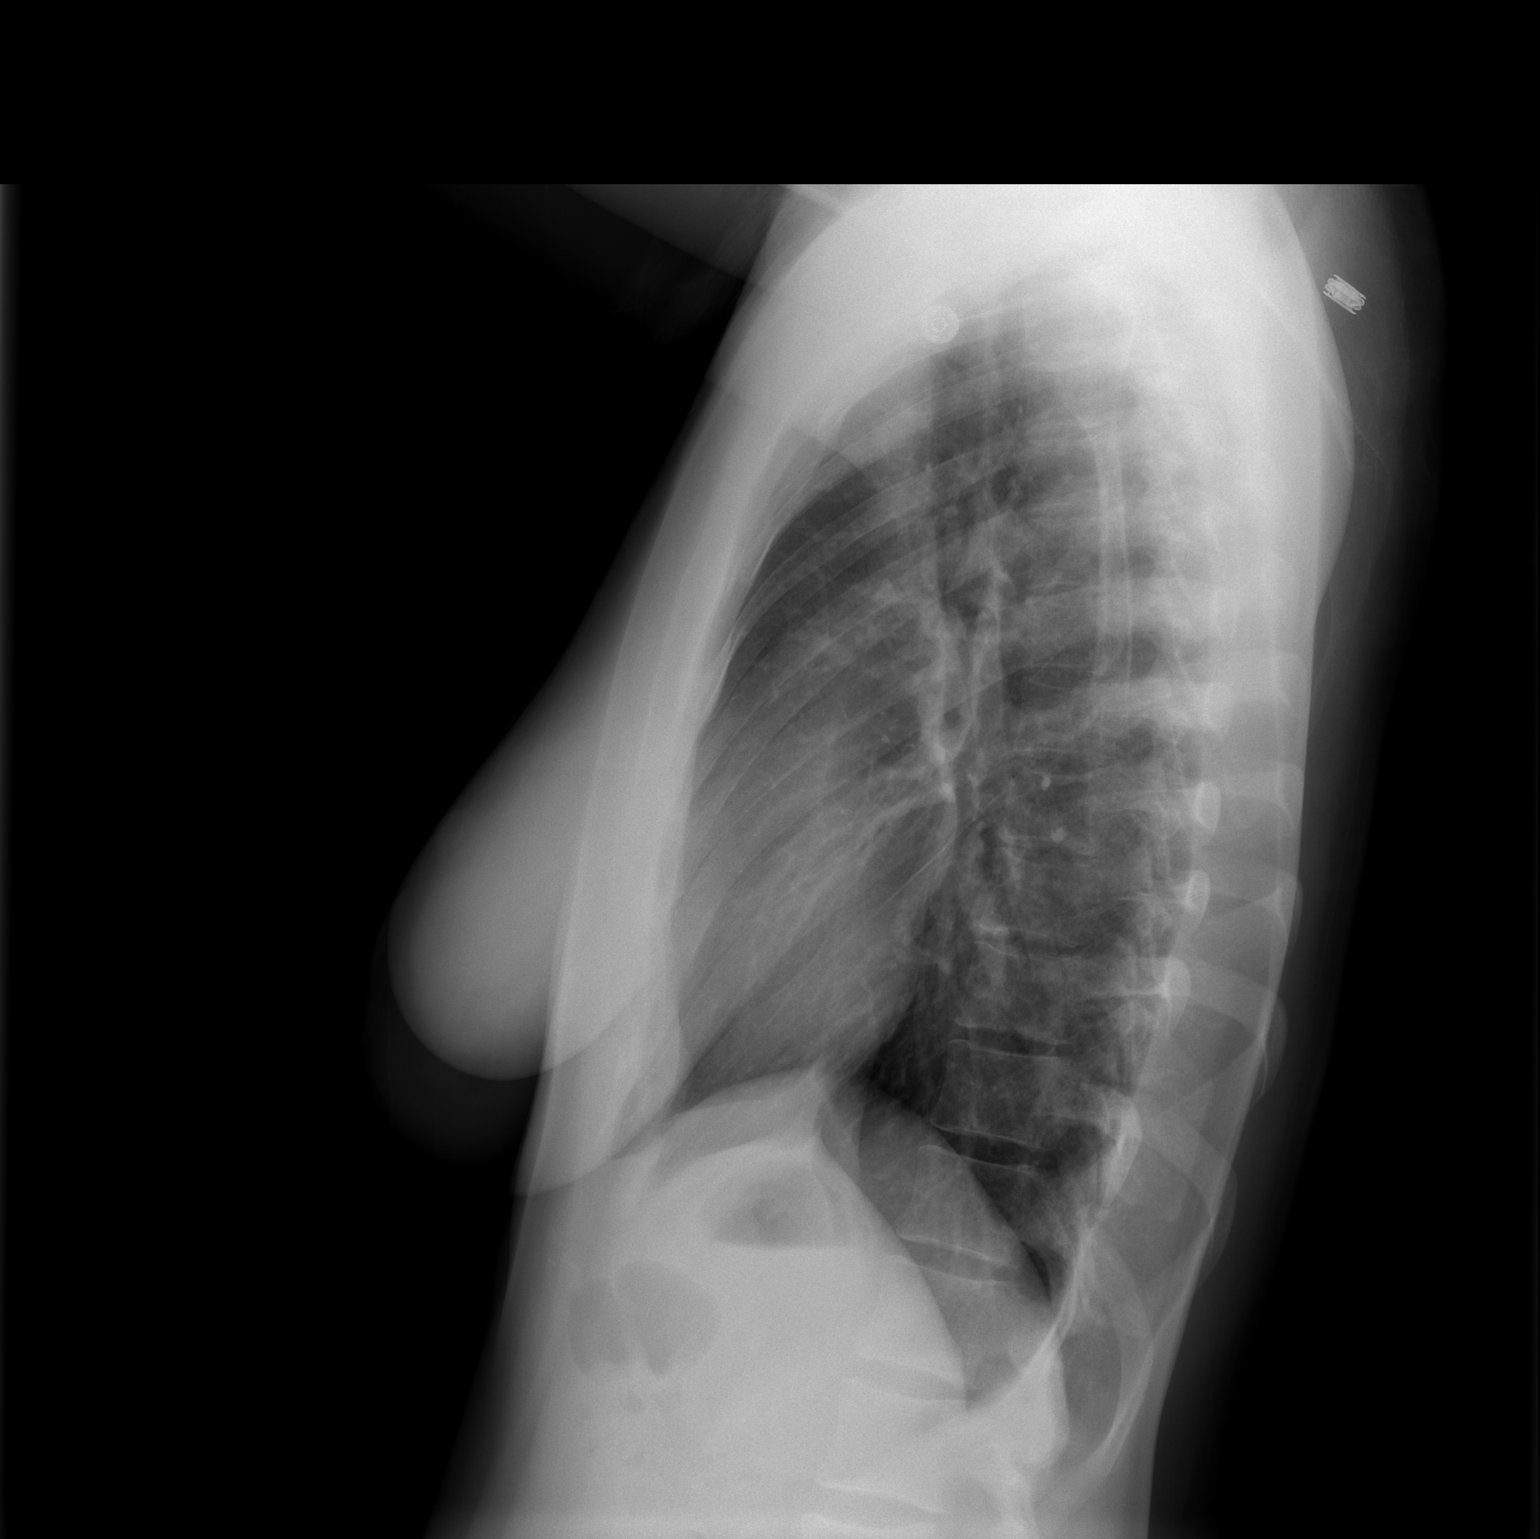

[2 of 2 positions shown; findings below may reference images not displayed]

FINDINGS: The heart size and mediastinal contours are within
normal limits.  Both lungs are clear.  The visualized skeletal
structures are unremarkable.
IMPRESSION: No active cardiopulmonary disease.

## 2012-12-16 ENCOUNTER — Encounter (HOSPITAL_COMMUNITY): Payer: Self-pay | Admitting: Emergency Medicine

## 2012-12-16 ENCOUNTER — Emergency Department (HOSPITAL_COMMUNITY)
Admission: EM | Admit: 2012-12-16 | Discharge: 2012-12-16 | Disposition: A | Discharge status: Home / Self Care | Payer: Self-pay | Attending: Emergency Medicine | Admitting: Emergency Medicine

## 2012-12-16 DIAGNOSIS — J45909 Unspecified asthma, uncomplicated: Secondary | ICD-10-CM | POA: Insufficient documentation

## 2012-12-16 DIAGNOSIS — Z3202 Encounter for pregnancy test, result negative: Secondary | ICD-10-CM | POA: Insufficient documentation

## 2012-12-16 DIAGNOSIS — Z8619 Personal history of other infectious and parasitic diseases: Secondary | ICD-10-CM | POA: Insufficient documentation

## 2012-12-16 DIAGNOSIS — Z87442 Personal history of urinary calculi: Secondary | ICD-10-CM | POA: Insufficient documentation

## 2012-12-16 DIAGNOSIS — Z87448 Personal history of other diseases of urinary system: Secondary | ICD-10-CM | POA: Insufficient documentation

## 2012-12-16 DIAGNOSIS — N390 Urinary tract infection, site not specified: Secondary | ICD-10-CM | POA: Insufficient documentation

## 2012-12-16 HISTORY — DX: Unspecified asthma, uncomplicated: J45.909

## 2012-12-16 LAB — URINE MICROSCOPIC-ADD ON

## 2012-12-16 LAB — URINALYSIS, ROUTINE W REFLEX MICROSCOPIC
Bilirubin Urine: NEGATIVE
Glucose, UA: NEGATIVE mg/dL
Ketones, ur: NEGATIVE mg/dL
Nitrite: NEGATIVE
Protein, ur: 30 mg/dL — AB
Specific Gravity, Urine: 1.014 (ref 1.005–1.030)
Urobilinogen, UA: 0.2 mg/dL (ref 0.0–1.0)
pH: 6 (ref 5.0–8.0)

## 2012-12-16 LAB — PREGNANCY, URINE: Preg Test, Ur: NEGATIVE

## 2012-12-16 MED ORDER — SULFAMETHOXAZOLE-TRIMETHOPRIM 800-160 MG PO TABS: 1.0000 | ORAL_TABLET | Freq: Two times a day (BID) | ORAL | Status: DC

## 2012-12-16 MED ORDER — SULFAMETHOXAZOLE-TMP DS 800-160 MG PO TABS
1.0000 | ORAL_TABLET | Freq: Once | ORAL | Status: AC
  Administered 2012-12-16: 1 via ORAL
  Filled 2012-12-16: qty 1

## 2012-12-16 MED ORDER — OXYCODONE-ACETAMINOPHEN 5-325 MG PO TABS
2.0000 | ORAL_TABLET | Freq: Once | ORAL | Status: AC
  Administered 2012-12-16: 2 via ORAL
  Filled 2012-12-16: qty 2

## 2012-12-16 NOTE — ED Notes (Signed)
Pt from home c/o dysuria, L flank pain. Pt denies emesis, blood in urine. Pt states that she is "a little short of breath, but I might be sending myself into a panic attack." Pt denies hx of kidney stones. Pt is A&O and in NAD. Pt room sats 98% RA

## 2012-12-20 LAB — URINE CULTURE: Colony Count: >=100000

## 2012-12-21 ENCOUNTER — Telehealth (HOSPITAL_COMMUNITY): Payer: Self-pay | Admitting: Emergency Medicine

## 2012-12-21 NOTE — ED Notes (Signed)
Post ED Visit - Positive Culture Follow-up  Culture report reviewed by antimicrobial stewardship pharmacist: []  Wes Dulaney, Pharm.D., BCPS []  Celedonio Miyamoto, Pharm.D., BCPS []  Georgina Pillion, Pharm.D., BCPS []  [place], [address].D., BCPS, AAHIVP []  Estella Husk, Pharm.D., BCPS, AAHIVP [x]  Louie Casa, [address].D., BCPS  Positive urine culture Treated with Sulfa-Trimeth, organism sensitive to the same and no further patient follow-up is required at this time.  Kylie A Holland 12/21/2012, 4:11 PM

## 2012-12-24 NOTE — ED Provider Notes (Signed)
CSN: [id number]     Arrival date & time 12/16/12  1723 History   First MD Initiated Contact with Patient 12/16/12 1736     Chief Complaint  Patient presents with  . Dysuria  . Flank Pain   (Consider location/radiation/quality/duration/timing/severity/associated sxs/prior Treatment) HPI  27 year old female with dysuria. Gradual onset about 2 days ago. Progressively worsening. Mild ache in her left flank. No unusual vaginal bleeding or discharge. No increased frequency or hematuria. No fevers or chills. Denies abdominal or back pain. No nausea or vomiting. History kidney stones. She's concerned she may have a UTI.  Past Medical History  Diagnosis Date  . Asthma   . Renal disorder   . Urosepsis    Past Surgical History  Procedure Laterality Date  . Bartholin gland cyst excision     No family history on file. History  Substance Use Topics  . Smoking status: Never Smoker   . Smokeless tobacco: Not on file  . Alcohol Use: Yes     Comment: socially   OB History   Grav Para Term Preterm Abortions TAB SAB Ect Mult Living                 Review of Systems  All systems reviewed and negative, other than as noted in HPI.   Allergies  Review of patient's allergies indicates no known allergies.  Home Medications   Current Outpatient Rx  Name  Route  Sig  Dispense  Refill  . ibuprofen (ADVIL,MOTRIN) 200 MG tablet   Oral   Take 600 mg by mouth every 6 (six) hours as needed for moderate pain.         Marland Kitchen sulfamethoxazole-trimethoprim (SEPTRA DS) 800-160 MG per tablet   Oral   Take 1 tablet by mouth every 12 (twelve) hours.   6 tablet   0    BP 111/79  Pulse 86  Temp(Src) 98 F (36.7 C) (Oral)  Resp 15  SpO2 100%  LMP 11/20/2012 Physical Exam  Nursing note and vitals reviewed. Constitutional: She appears well-developed and well-nourished. No distress.  HENT:  Head: Normocephalic and atraumatic.  Eyes: Conjunctivae are normal. Right eye exhibits no discharge. Left  eye exhibits no discharge.  Neck: Neck supple.  Cardiovascular: Normal rate, regular rhythm and normal heart sounds.  Exam reveals no gallop and no friction rub.   No murmur heard. Pulmonary/Chest: Effort normal and breath sounds normal. No respiratory distress.  Abdominal: Soft. She exhibits no distension. There is no tenderness.  Genitourinary:  No CVA tenderness  Musculoskeletal: She exhibits no edema and no tenderness.  Neurological: She is alert.  Skin: Skin is warm and dry.  Psychiatric: She has a normal mood and affect. Her behavior is normal. Thought content normal.    ED Course  Procedures (including critical care time) Labs Review Labs Reviewed  URINALYSIS, ROUTINE W REFLEX MICROSCOPIC - Abnormal; Notable for the following:    APPearance CLOUDY (*)    Hgb urine dipstick MODERATE (*)    Protein, ur 30 (*)    Leukocytes, UA MODERATE (*)    All other components within normal limits  URINE MICROSCOPIC-ADD ON - Abnormal; Notable for the following:    Bacteria, UA MANY (*)    All other components within normal limits  URINE CULTURE  PREGNANCY, URINE   Imaging Review No results found.  EKG Interpretation   None       MDM   1. UTI (lower urinary tract infection)    27 year old female with  symptoms and urinalysis consistent with UTI. Afebrile and well appearing. Hemodynamically stable. I feel she is appropriate for outpatient treatment at this time. Return precautions were discussed.   Raeford Razor, MD 12/24/12 2009

## 2013-05-20 ENCOUNTER — Emergency Department (HOSPITAL_COMMUNITY)
Admission: EM | Admit: 2013-05-20 | Discharge: 2013-05-20 | Disposition: A | Discharge status: Home / Self Care | Payer: Self-pay | Attending: Emergency Medicine | Admitting: Emergency Medicine

## 2013-05-20 DIAGNOSIS — J45909 Unspecified asthma, uncomplicated: Secondary | ICD-10-CM | POA: Insufficient documentation

## 2013-05-20 DIAGNOSIS — Z3202 Encounter for pregnancy test, result negative: Secondary | ICD-10-CM | POA: Insufficient documentation

## 2013-05-20 DIAGNOSIS — R112 Nausea with vomiting, unspecified: Secondary | ICD-10-CM | POA: Insufficient documentation

## 2013-05-20 DIAGNOSIS — Z87448 Personal history of other diseases of urinary system: Secondary | ICD-10-CM | POA: Insufficient documentation

## 2013-05-20 DIAGNOSIS — Z87440 Personal history of urinary (tract) infections: Secondary | ICD-10-CM | POA: Insufficient documentation

## 2013-05-20 LAB — URINALYSIS, ROUTINE W REFLEX MICROSCOPIC
Bilirubin Urine: NEGATIVE
Glucose, UA: NEGATIVE mg/dL
HGB URINE DIPSTICK: NEGATIVE
Ketones, ur: 15 mg/dL — AB
Leukocytes, UA: NEGATIVE
NITRITE: NEGATIVE
PH: 6.5 (ref 5.0–8.0)
Protein, ur: NEGATIVE mg/dL
SPECIFIC GRAVITY, URINE: 1.025 (ref 1.005–1.030)
UROBILINOGEN UA: 1 mg/dL (ref 0.0–1.0)

## 2013-05-20 LAB — COMPREHENSIVE METABOLIC PANEL
ALT: 13 U/L (ref 0–35)
AST: 21 U/L (ref 0–37)
Albumin: 4.3 g/dL (ref 3.5–5.2)
Alkaline Phosphatase: 51 U/L (ref 39–117)
BUN: 14 mg/dL (ref 6–23)
CALCIUM: 9.4 mg/dL (ref 8.4–10.5)
CO2: 24 mEq/L (ref 19–32)
Chloride: 105 mEq/L (ref 96–112)
Creatinine, Ser: 0.67 mg/dL (ref 0.50–1.10)
GFR calc Af Amer: >90 mL/min (ref >90)
GFR calc non Af Amer: >90 mL/min (ref >90)
GLUCOSE: 81 mg/dL (ref 70–99)
Potassium: 4.1 mEq/L (ref 3.7–5.3)
Sodium: 141 mEq/L (ref 137–147)
Total Bilirubin: 0.3 mg/dL (ref 0.3–1.2)
Total Protein: 7.9 g/dL (ref 6.0–8.3)

## 2013-05-20 LAB — CBC WITH DIFFERENTIAL/PLATELET
Basophils Absolute: 0 10*3/uL (ref 0.0–0.1)
Basophils Relative: 1 % (ref 0–1)
EOS PCT: 1 % (ref 0–5)
Eosinophils Absolute: 0.1 10*3/uL (ref 0.0–0.7)
HEMATOCRIT: 37.7 % (ref 36.0–46.0)
HEMOGLOBIN: 12.9 g/dL (ref 12.0–15.0)
Lymphocytes Relative: 22 % (ref 12–46)
Lymphs Abs: 1.8 10*3/uL (ref 0.7–4.0)
MCH: 30.1 pg (ref 26.0–34.0)
MCHC: 34.2 g/dL (ref 30.0–36.0)
MCV: 88.1 fL (ref 78.0–100.0)
MONO ABS: 0.5 10*3/uL (ref 0.1–1.0)
Monocytes Relative: 6 % (ref 3–12)
Neutro Abs: 5.8 10*3/uL (ref 1.7–7.7)
Neutrophils Relative %: 71 % (ref 43–77)
Platelets: 240 10*3/uL (ref 150–400)
RBC: 4.28 MIL/uL (ref 3.87–5.11)
RDW: 13.3 % (ref 11.5–15.5)
WBC: 8.2 10*3/uL (ref 4.0–10.5)

## 2013-05-20 LAB — LIPASE, BLOOD: LIPASE: 22 U/L (ref 11–59)

## 2013-05-20 LAB — POC URINE PREG, ED: Preg Test, Ur: NEGATIVE

## 2013-05-20 MED ORDER — PROMETHAZINE HCL 12.5 MG PO TABS: 12.5000 mg | ORAL_TABLET | Freq: Four times a day (QID) | ORAL | Status: DC | PRN

## 2013-05-20 MED ORDER — SODIUM CHLORIDE 0.9 % IV BOLUS (SEPSIS)
1000.0000 mL | Freq: Once | INTRAVENOUS | Status: AC
  Administered 2013-05-20: 1000 mL via INTRAVENOUS

## 2013-05-20 MED ORDER — ONDANSETRON HCL 4 MG/2ML IJ SOLN
4.0000 mg | Freq: Once | INTRAMUSCULAR | Status: AC
  Administered 2013-05-20: 4 mg via INTRAVENOUS
  Filled 2013-05-20: qty 2

## 2013-05-20 MED ORDER — ONDANSETRON HCL 4 MG/2ML IJ SOLN
4.0000 mg | Freq: Once | INTRAMUSCULAR | Status: AC
Start: 2013-05-20 — End: 2013-05-20
  Administered 2013-05-20: 4 mg via INTRAVENOUS
  Filled 2013-05-20: qty 2

## 2013-05-20 NOTE — ED Provider Notes (Signed)
Medical screening examination/treatment/procedure(s) were performed by non-physician practitioner and as supervising physician I was immediately available for consultation/collaboration.   EKG Interpretation None       Doug SouSam Shawntae Lowy, MD 05/20/13 2231

## 2013-05-20 NOTE — ED Notes (Signed)
Pt c/o NV.  No pain.  No diarrhea.  Sx since she woke up this morning. Took a pregnancy test this morning.  It was negative.

## 2013-05-20 NOTE — Discharge Instructions (Signed)
Your lab work is normal today. Take phenergan for nausea as needed. This medication will make you drowsy, dont drive or operate any machinery if taking. Rest. Drink plenty of fluids. Advance diet as tolerate. Follow up with your doctor. Return if symptoms are worsening.     Nausea and Vomiting Nausea is a sick feeling that often comes before throwing up (vomiting). Vomiting is a reflex where stomach contents come out of your mouth. Vomiting can cause severe loss of body fluids (dehydration). Children and elderly adults can become dehydrated quickly, especially if they also have diarrhea. Nausea and vomiting are symptoms of a condition or disease. It is important to find the cause of your symptoms. CAUSES   Direct irritation of the stomach lining. This irritation can result from increased acid production (gastroesophageal reflux disease), infection, food poisoning, taking certain medicines (such as nonsteroidal anti-inflammatory drugs), alcohol use, or tobacco use.  Signals from the brain.These signals could be caused by a headache, heat exposure, an inner ear disturbance, increased pressure in the brain from injury, infection, a tumor, or a concussion, pain, emotional stimulus, or metabolic problems.  An obstruction in the gastrointestinal tract (bowel obstruction).  Illnesses such as diabetes, hepatitis, gallbladder problems, appendicitis, kidney problems, cancer, sepsis, atypical symptoms of a heart attack, or eating disorders.  Medical treatments such as chemotherapy and radiation.  Receiving medicine that makes you sleep (general anesthetic) during surgery. DIAGNOSIS Your caregiver may ask for tests to be done if the problems do not improve after a few days. Tests may also be done if symptoms are severe or if the reason for the nausea and vomiting is not clear. Tests may include:  Urine tests.  Blood tests.  Stool tests.  Cultures (to look for evidence of infection).  X-rays or  other imaging studies. Test results can help your caregiver make decisions about treatment or the need for additional tests. TREATMENT You need to stay well hydrated. Drink frequently but in small amounts.You may wish to drink water, sports drinks, clear broth, or eat frozen ice pops or gelatin dessert to help stay hydrated.When you eat, eating slowly may help prevent nausea.There are also some antinausea medicines that may help prevent nausea. HOME CARE INSTRUCTIONS   Take all medicine as directed by your caregiver.  If you do not have an appetite, do not force yourself to eat. However, you must continue to drink fluids.  If you have an appetite, eat a normal diet unless your caregiver tells you differently.  Eat a variety of complex carbohydrates (rice, wheat, potatoes, bread), lean meats, yogurt, fruits, and vegetables.  Avoid high-fat foods because they are more difficult to digest.  Drink enough water and fluids to keep your urine clear or pale yellow.  If you are dehydrated, ask your caregiver for specific rehydration instructions. Signs of dehydration may include:  Severe thirst.  Dry lips and mouth.  Dizziness.  Dark urine.  Decreasing urine frequency and amount.  Confusion.  Rapid breathing or pulse. SEEK IMMEDIATE MEDICAL CARE IF:   You have blood or brown flecks (like coffee grounds) in your vomit.  You have black or bloody stools.  You have a severe headache or stiff neck.  You are confused.  You have severe abdominal pain.  You have chest pain or trouble breathing.  You do not urinate at least once every 8 hours.  You develop cold or clammy skin.  You continue to vomit for longer than 24 to 48 hours.  You have a  fever. MAKE SURE YOU:   Understand these instructions.  Will watch your condition.  Will get help right away if you are not doing well or get worse. Document Released: 12/25/2004 Document Revised: 03/19/2011 Document Reviewed:  05/24/2010 [facility] Patient Information 2014 [employer], [place].

## 2013-05-20 NOTE — Progress Notes (Signed)
  CARE MANAGEMENT ED NOTE 05/20/2013  Patient:  Carmen Mcgee,Carmen Mcgee   Account Number:  [id number]  Date Initiated:  05/20/2013  Documentation initiated by:  Radford PaxFERRERO,Zayvon Alicea  Subjective/Objective Assessment:   Patient presents to Ed with nausea and vomitting.     Subjective/Objective Assessment Detail:   Patient without pcp or insurance.  Patient seen by [facility] rep and given information about the orange card and obataining a pcp.     Action/Plan:   Action/Plan Detail:   Anticipated DC Date:       Status Recommendation to Physician:   Result of Recommendation:    Other ED Services  Consult Working Plan    DC Planning Services  Other  PCP issues    Choice offered to / List presented to:            Status of service:  Completed, signed off  ED Comments:   ED Comments Detail:  Cristy FriedlanderStacy Wright  Signed  Progress Notes Service date: 05/20/2013 4:38 PM [facility] provided pt with a list of primary care resources to help patient establish primary care.

## 2013-05-20 NOTE — ED Provider Notes (Signed)
CSN: [id number]     Arrival date & time 05/20/13  1419 History   First MD Initiated Contact with Patient 05/20/13 1549     Chief Complaint  Patient presents with  . Emesis     (Consider location/radiation/quality/duration/timing/severity/associated sxs/prior Treatment) HPI Carmen Mcgee is a 28 y.o. female who presents to emergency department complaining of nausea and vomiting. Patient states she woke up this morning with symptoms. States over 5 episodes of emesis. Denies any pain. No diarrhea. Denies fever, chills. No urinary symptoms. No vaginal discharge or bleeding. Last period April 11th, states took pregnancy test this morning and it was negative. Pt denies any blood in stool or emesis. States had a normal bowel movement today. States eating or drinking makes her symptoms worse. Nothing makes it better. States that she is unable to keep anything down. No medications taken prior to coming in.    Past Medical History  Diagnosis Date  . Asthma   . Renal disorder   . Urosepsis    Past Surgical History  Procedure Laterality Date  . Bartholin gland cyst excision     No family history on file. History  Substance Use Topics  . Smoking status: Never Smoker   . Smokeless tobacco: Not on file  . Alcohol Use: Yes     Comment: socially   OB History   Grav Para Term Preterm Abortions TAB SAB Ect Mult Living                 Review of Systems  Constitutional: Negative for fever and chills.  Respiratory: Negative for cough, chest tightness and shortness of breath.   Cardiovascular: Negative for chest pain, palpitations and leg swelling.  Gastrointestinal: Positive for nausea and vomiting. Negative for abdominal pain, diarrhea and blood in stool.  Genitourinary: Negative for dysuria, flank pain, vaginal bleeding, vaginal discharge, difficulty urinating, vaginal pain and pelvic pain.  Musculoskeletal: Negative for arthralgias, myalgias, neck pain and neck stiffness.  Skin: Negative  for rash.  Neurological: Negative for dizziness, weakness and headaches.  All other systems reviewed and are negative.     Allergies  Review of patient's allergies indicates no known allergies.  Home Medications   Prior to Admission medications   Medication Sig Start Date End Date Taking? Authorizing Provider  ibuprofen (ADVIL,MOTRIN) 200 MG tablet Take 600 mg by mouth every 6 (six) hours as needed for moderate pain.    Historical Provider, MD   BP 114/74  Pulse 74  Temp(Src) 98.7 F (37.1 C) (Oral)  Resp 18  SpO2 100%  LMP 04/28/2013 Physical Exam  Nursing note and vitals reviewed. Constitutional: She appears well-developed and well-nourished. No distress.  HENT:  Head: Normocephalic.  Mouth/Throat: Oropharynx is clear and moist.  Eyes: Conjunctivae are normal.  Neck: Neck supple.  Cardiovascular: Normal rate, regular rhythm and normal heart sounds.   Pulmonary/Chest: Effort normal and breath sounds normal. No respiratory distress. She has no wheezes. She has no rales.  Abdominal: Soft. Bowel sounds are normal. She exhibits no distension. There is no tenderness. There is no rebound and no guarding.  No CVA tenderness  Musculoskeletal: She exhibits no edema.  Neurological: She is alert.  Skin: Skin is warm and dry.  Psychiatric: She has a normal mood and affect. Her behavior is normal.    ED Course  Procedures (including critical care time) Labs Review Labs Reviewed  URINALYSIS, ROUTINE W REFLEX MICROSCOPIC - Abnormal; Notable for the following:    Ketones, ur 15 (*)  All other components within normal limits  CBC WITH DIFFERENTIAL  COMPREHENSIVE METABOLIC PANEL  LIPASE, BLOOD  POC URINE PREG, ED    Imaging Review No results found.   EKG Interpretation None      MDM   Final diagnoses:  Nausea & vomiting    Pt with nausea, vomiting. No abdominal pain. No diarrhea. Exam shows no abdominal tenderness. Will get labs, IV fluids, zofran.    5:53  PM Pt feeling much better. Drank fluids in ED. Has no symptoms at this time. Labs all normal. Urine preg negative. Home with phenergan for nausea, follow up with PCP. Return instructions provided.   Filed Vitals:   05/20/13 1426  BP: 114/74  Pulse: 74  Temp: 98.7 F (37.1 C)  TempSrc: Oral  Resp: 18  SpO2: 100%      Ysenia Filice A Cande Mastropietro, PA-C 05/20/13 1756

## 2013-05-20 NOTE — Progress Notes (Signed)
[facility] provided pt with a list of primary care resources to help patient establish primary care.  °

## 2013-07-02 ENCOUNTER — Inpatient Hospital Stay (HOSPITAL_COMMUNITY): Payer: Medicaid Other

## 2013-07-02 ENCOUNTER — Encounter (HOSPITAL_COMMUNITY): Payer: Self-pay | Admitting: *Deleted

## 2013-07-02 ENCOUNTER — Inpatient Hospital Stay (HOSPITAL_COMMUNITY)
Admission: AD | Admit: 2013-07-02 | Discharge: 2013-07-02 | Disposition: A | Discharge status: Home / Self Care | Payer: Medicaid Other | Source: Ambulatory Visit | Attending: Obstetrics & Gynecology | Admitting: Obstetrics & Gynecology

## 2013-07-02 DIAGNOSIS — B9689 Other specified bacterial agents as the cause of diseases classified elsewhere: Secondary | ICD-10-CM | POA: Diagnosis not present

## 2013-07-02 DIAGNOSIS — O2390 Unspecified genitourinary tract infection in pregnancy, unspecified trimester: Secondary | ICD-10-CM | POA: Insufficient documentation

## 2013-07-02 DIAGNOSIS — O26899 Other specified pregnancy related conditions, unspecified trimester: Secondary | ICD-10-CM

## 2013-07-02 DIAGNOSIS — O219 Vomiting of pregnancy, unspecified: Secondary | ICD-10-CM

## 2013-07-02 DIAGNOSIS — O210 Mild hyperemesis gravidarum: Secondary | ICD-10-CM | POA: Insufficient documentation

## 2013-07-02 DIAGNOSIS — A499 Bacterial infection, unspecified: Secondary | ICD-10-CM | POA: Insufficient documentation

## 2013-07-02 DIAGNOSIS — R1031 Right lower quadrant pain: Secondary | ICD-10-CM | POA: Diagnosis not present

## 2013-07-02 DIAGNOSIS — J45909 Unspecified asthma, uncomplicated: Secondary | ICD-10-CM | POA: Diagnosis not present

## 2013-07-02 DIAGNOSIS — O99891 Other specified diseases and conditions complicating pregnancy: Secondary | ICD-10-CM | POA: Diagnosis not present

## 2013-07-02 DIAGNOSIS — R109 Unspecified abdominal pain: Secondary | ICD-10-CM

## 2013-07-02 DIAGNOSIS — N760 Acute vaginitis: Secondary | ICD-10-CM | POA: Insufficient documentation

## 2013-07-02 DIAGNOSIS — O9989 Other specified diseases and conditions complicating pregnancy, childbirth and the puerperium: Principal | ICD-10-CM

## 2013-07-02 LAB — WET PREP, GENITAL
Trich, Wet Prep: NONE SEEN
Yeast Wet Prep HPF POC: NONE SEEN

## 2013-07-02 LAB — URINALYSIS, ROUTINE W REFLEX MICROSCOPIC
Bilirubin Urine: NEGATIVE
Glucose, UA: NEGATIVE mg/dL
Hgb urine dipstick: NEGATIVE
Ketones, ur: 40 mg/dL — AB
LEUKOCYTES UA: NEGATIVE
Nitrite: NEGATIVE
Protein, ur: NEGATIVE mg/dL
UROBILINOGEN UA: 1 mg/dL (ref 0.0–1.0)
pH: 6 (ref 5.0–8.0)

## 2013-07-02 LAB — CBC
HEMATOCRIT: 35 % — AB (ref 36.0–46.0)
HEMOGLOBIN: 12.4 g/dL (ref 12.0–15.0)
MCH: 31 pg (ref 26.0–34.0)
MCHC: 35.4 g/dL (ref 30.0–36.0)
MCV: 87.5 fL (ref 78.0–100.0)
Platelets: 258 10*3/uL (ref 150–400)
RBC: 4 MIL/uL (ref 3.87–5.11)
RDW: 12.4 % (ref 11.5–15.5)
WBC: 10 10*3/uL (ref 4.0–10.5)

## 2013-07-02 LAB — OB RESULTS CONSOLE GC/CHLAMYDIA
Chlamydia: NEGATIVE
GC PROBE AMP, GENITAL: NEGATIVE

## 2013-07-02 LAB — POCT PREGNANCY, URINE: PREG TEST UR: POSITIVE — AB

## 2013-07-02 LAB — HCG, QUANTITATIVE, PREGNANCY: hCG, Beta Chain, Quant, S: 201815 m[IU]/mL — ABNORMAL HIGH (ref <5)

## 2013-07-02 LAB — ABO/RH: ABO/RH(D): A POS

## 2013-07-02 MED ORDER — METOCLOPRAMIDE HCL 10 MG PO TABS: 10.0000 mg | ORAL_TABLET | Freq: Four times a day (QID) | ORAL | Status: DC

## 2013-07-02 MED ORDER — PROMETHAZINE HCL 25 MG/ML IJ SOLN
Freq: Once | INTRAVENOUS | Status: AC
  Administered 2013-07-02: 18:00:00 via INTRAVENOUS
  Filled 2013-07-02: qty 1000

## 2013-07-02 MED ORDER — METRONIDAZOLE 500 MG PO TABS: 500.0000 mg | ORAL_TABLET | Freq: Two times a day (BID) | ORAL | Status: DC

## 2013-07-02 MED ORDER — ACETAMINOPHEN 325 MG PO TABS
650.0000 mg | ORAL_TABLET | Freq: Once | ORAL | Status: AC
  Administered 2013-07-02: 650 mg via ORAL
  Filled 2013-07-02: qty 2

## 2013-07-02 MED ORDER — LACTATED RINGERS IV SOLN
INTRAVENOUS | Status: DC
  Filled 2013-07-02 (×13): qty 1000

## 2013-07-02 MED ORDER — PROMETHAZINE HCL 25 MG PO TABS: 25.0000 mg | ORAL_TABLET | Freq: Four times a day (QID) | ORAL | Status: DC | PRN

## 2013-07-02 NOTE — Progress Notes (Deleted)
Pt self positioned maternal heart rate monitored.

## 2013-07-02 NOTE — Progress Notes (Signed)
Pt repositioned

## 2013-07-02 NOTE — MAU Note (Signed)
Pt states she has nausea and vomiting that became worse the beginning of June.  Increased Vaginal discharge that started 2 wks ago. Pt also complains of pain in pelvic area on the right side

## 2013-07-02 NOTE — MAU Provider Note (Signed)
History     CSN: [id number]  Arrival date and time: 07/02/13 1528   First Jonanthony Nahar Initiated Contact with Patient 07/02/13 1612      Chief Complaint  Patient presents with  . Emesis  . Possible Pregnancy  . Vaginal Discharge   HPI Comments: Carmen Mcgee [age] y.o. [id number] [place] [redacted] weeks and 2 days pregnant who is complaining of abdominal pain and vomiting for one week. She says the pain has 2 part first in sharp in right side and that is about "8" and then constant dull pain that is a "4" on 1/10 scale. She has taken no medications. [facility].     Emesis  Associated symptoms include abdominal pain.  Possible Pregnancy Associated symptoms include abdominal pain, nausea and vomiting.  Vaginal Discharge Associated symptoms include abdominal pain, nausea and vomiting.      Past Medical History  Diagnosis Date  . Asthma   . Renal disorder   . Urosepsis     Past Surgical History  Procedure Laterality Date  . Bartholin gland cyst excision      Family History  Problem Relation Age of Onset  . Hypertension Mother   . Migraines Mother   . Anemia Mother   . Diabetes Father   . Sleep apnea Father     History  Substance Use Topics  . Smoking status: Never Smoker   . Smokeless tobacco: Not on file  . Alcohol Use: Yes     Comment: socially    Allergies: No Known Allergies  Prescriptions prior to admission  Medication Sig Dispense Refill  . Prenatal Vit-Fe Fumarate-FA (PRENATAL MULTIVITAMIN) TABS tablet Take 1 tablet by mouth daily at 12 noon.      . promethazine (PHENERGAN) 12.5 MG tablet Take 1 tablet (12.5 mg total) by mouth every 6 (six) hours as needed for nausea or vomiting.  10 tablet  0    Review of Systems  Constitutional: Negative.        Uncomfortable with nausea and vomiting  HENT: Negative.   Respiratory: Negative.   Cardiovascular: Negative.   Gastrointestinal: Positive for nausea, vomiting and abdominal pain.   Genitourinary: Negative.   Musculoskeletal: Negative.   Skin: Negative.   Neurological: Negative.   Psychiatric/Behavioral: Negative.    Physical Exam   Height 5' 5.5" (1.664 m), weight 57.97 kg (127 lb 12.8 oz).  Physical Exam  Constitutional: She is oriented to person, place, and time. She appears well-developed and well-nourished. No distress.  HENT:  Head: Normocephalic and atraumatic.  Eyes: Conjunctivae are normal. Pupils are equal, round, and reactive to light.  Cardiovascular: Normal rate, regular rhythm and normal heart sounds.   Respiratory: Effort normal and breath sounds normal.  GI: Soft. Bowel sounds are normal. She exhibits no distension. There is tenderness. There is no rebound and no guarding.  Genitourinary:  Genital:External negative Vaginal:moderate amount creamy discharge Cervix:closed/ thick Bimanual: nontender. pregnant   Musculoskeletal: Normal range of motion.  Neurological: She is alert and oriented to person, place, and time.  Skin: Skin is warm and dry.  Psychiatric: She has a normal mood and affect. Her behavior is normal. Judgment and thought content normal.   Results for orders placed during the hospital encounter of 07/02/13 (from the past 24 hour(s))  URINALYSIS, ROUTINE W REFLEX MICROSCOPIC     Status: Abnormal   Collection Time    07/02/13  3:40 PM      Result Value Ref Range  Color, Urine YELLOW  YELLOW   APPearance HAZY (*) CLEAR   Specific Gravity, Urine >1.030 (*) 1.005 - 1.030   pH 6.0  5.0 - 8.0   Glucose, UA NEGATIVE  NEGATIVE mg/dL   Hgb urine dipstick NEGATIVE  NEGATIVE   Bilirubin Urine NEGATIVE  NEGATIVE   Ketones, ur 40 (*) NEGATIVE mg/dL   Protein, ur NEGATIVE  NEGATIVE mg/dL   Urobilinogen, UA 1.0  0.0 - 1.0 mg/dL   Nitrite NEGATIVE  NEGATIVE   Leukocytes, UA NEGATIVE  NEGATIVE  POCT PREGNANCY, URINE     Status: Abnormal   Collection Time    07/02/13  3:42 PM      Result Value Ref Range   Preg Test, Ur POSITIVE (*)  NEGATIVE  WET PREP, GENITAL     Status: Abnormal   Collection Time    07/02/13  4:40 PM      Result Value Ref Range   Yeast Wet Prep HPF POC NONE SEEN  NONE SEEN   Trich, Wet Prep NONE SEEN  NONE SEEN   Clue Cells Wet Prep HPF POC MODERATE (*) NONE SEEN   WBC, Wet Prep HPF POC MANY (*) NONE SEEN  CBC     Status: Abnormal   Collection Time    07/02/13  4:55 PM      Result Value Ref Range   WBC 10.0  4.0 - 10.5 K/uL   RBC 4.00  3.87 - 5.11 MIL/uL   Hemoglobin 12.4  12.0 - 15.0 g/dL   HCT 16.135.0 (*) 09.636.0 - 04.546.0 %   MCV 87.5  78.0 - 100.0 fL   MCH 31.0  26.0 - 34.0 pg   MCHC 35.4  30.0 - 36.0 g/dL   RDW 40.912.4  81.111.5 - 91.415.5 %   Platelets 258  150 - 400 K/uL  HCG, QUANTITATIVE, PREGNANCY     Status: Abnormal   Collection Time    07/02/13  4:55 PM      Result Value Ref Range   hCG, Beta Francene FindersChain, Quant, S 201815 (*) <5 mIU/mL  ABO/RH     Status: None   Collection Time    07/02/13  4:55 PM      Result Value Ref Range   ABO/RH(D) A POS     [place] Ob Comp Less 14 Wks  07/02/2013   CLINICAL DATA:  Vomiting, right lower quadrant pain  EXAM: OBSTETRIC <14 WK US AND TRANSVAGINAL OB US  TECHNIQUE: Both transabdominal and transvaginal ultrasound examinations were performed for complete evaluation of the gestation as well as the maternal uterus, adnexal regions, and pelvic cul-de-sac. Transvaginal technique was performed to assess early pregnancy.  COMPARISON:  None.  FINDINGS: Intrauterine gestational sac: Visualized/normal in shape.  Yolk sac:  Present.  Embryo:  Present.  Cardiac Activity: Present.  Heart Rate:  170 bpm  CRL:   22.9  mm   9 w 0d                  US EDC: 02/04/2014  Maternal uterus/adnexae: No adnexal mass. Hypoechoic right ovarian mass with peripheral flow consistent with a corpus luteum cyst. Bilateral ovaries are otherwise normal. No pelvic free fluid.  IMPRESSION: Single live intrauterine pregnancy dating 9 weeks 0 days with a current ultrasound EDC of 02/04/2014.   Electronically Signed    By: Elige KoHetal  Patel   On: 07/02/2013 17:42   [place] Ob Transvaginal  07/02/2013   CLINICAL DATA:  Vomiting, right lower quadrant pain  EXAM: OBSTETRIC <  14 WK [place] AND TRANSVAGINAL OB US  TECHNIQUE: Both transabdominal and transvaginal ultrasound examinations were performed for complete evaluation of the gestation as well as the maternal uterus, adnexal regions, and pelvic cul-de-sac. Transvaginal technique was performed to assess early pregnancy.  COMPARISON:  None.  FINDINGS: Intrauterine gestational sac: Visualized/normal in shape.  Yolk sac:  Present.  Embryo:  Present.  Cardiac Activity: Present.  Heart Rate:  170 bpm  CRL:   22.9  mm   9 w 0d                  [place] EDC: 02/04/2014  Maternal uterus/adnexae: No adnexal mass. Hypoechoic right ovarian mass with peripheral flow consistent with a corpus luteum cyst. Bilateral ovaries are otherwise normal. No pelvic free fluid.  IMPRESSION: Single live intrauterine pregnancy dating 9 weeks 0 days with a current ultrasound EDC of 02/04/2014.   Electronically Signed   By: Elige Ko   On: 07/02/2013 17:42    MAU Course  Procedures  MDM Wet prep, GC, Chlamydia, CBC, UA, U/S, ABORh, Quant Tylenol for pain IVF LR with phenergan 25 mg  Assessment and Plan   A: Abdominal pain in early pregnancy Bacterial Vaginosis  P: Above orders Flagyl 500 mg po BID x 7 days Establish care at [facility] Phenergan 25 mg po for bedtime Reglan 10 mg BID prn daytime Discussed small frequent meals and food choices  Carolynn Serve 07/02/2013, 4:29 PM

## 2013-07-02 NOTE — Discharge Instructions (Signed)
Morning Sickness Morning sickness is when you feel sick to your stomach (nauseous) during pregnancy. This nauseous feeling may or may not come with vomiting. It often occurs in the morning but can be a problem any time of day. Morning sickness is most common during the first trimester, but it may continue throughout pregnancy. While morning sickness is unpleasant, it is usually harmless unless you develop severe and continual vomiting (hyperemesis gravidarum). This condition requires more intense treatment.  CAUSES  The cause of morning sickness is not completely known but seems to be related to normal hormonal changes that occur in pregnancy. RISK FACTORS You are at greater risk if you:  Experienced nausea or vomiting before your pregnancy.  Had morning sickness during a previous pregnancy.  Are pregnant with more than one baby, such as twins. TREATMENT  Do not use any medicines (prescription, over-the-counter, or herbal) for morning sickness without first talking to your health care provider. Your health care provider may prescribe or recommend:  Vitamin B6 supplements.  Anti-nausea medicines.  The herbal medicine ginger. HOME CARE INSTRUCTIONS   Only take over-the-counter or prescription medicines as directed by your health care provider.  Taking multivitamins before getting pregnant can prevent or decrease the severity of morning sickness in most women.  Eat a piece of dry toast or unsalted crackers before getting out of bed in the morning.  Eat five or six small meals a day.  Eat dry and bland foods (rice, baked potato). Foods high in carbohydrates are often helpful.  Do not drink liquids with your meals. Drink liquids between meals.  Avoid greasy, fatty, and spicy foods.  Get someone to cook for you if the smell of any food causes nausea and vomiting.  If you feel nauseous after taking prenatal vitamins, take the vitamins at night or with a snack.  Snack on protein  foods (nuts, yogurt, cheese) between meals if you are hungry.  Eat unsweetened gelatins for desserts.  Wearing an acupressure wristband (worn for sea sickness) may be helpful.  Acupuncture may be helpful.  Do not smoke.  Get a humidifier to keep the air in your house free of odors.  Get plenty of fresh air. SEEK MEDICAL CARE IF:   Your home remedies are not working, and you need medicine.  You feel dizzy or lightheaded.  You are losing weight. SEEK IMMEDIATE MEDICAL CARE IF:   You have persistent and uncontrolled nausea and vomiting.  You pass out (faint). MAKE SURE YOU:  Understand these instructions.  Will watch your condition.  Will get help right away if you are not doing well or get worse. Document Released: 02/15/2006 Document Revised: 12/30/2012 Document Reviewed: 06/11/2012 [facility] Patient Information 2015 [place], [place]. This information is not intended to replace advice given to you by your health care provider. Make sure you discuss any questions you have with your health care provider. Abdominal Pain During Pregnancy Abdominal pain is common in pregnancy. Most of the time, it does not cause harm. There are many causes of abdominal pain. Some causes are more serious than others. Some of the causes of abdominal pain in pregnancy are easily diagnosed. Occasionally, the diagnosis takes time to understand. Other times, the cause is not determined. Abdominal pain can be a sign that something is very wrong with the pregnancy, or the pain may have nothing to do with the pregnancy at all. For this reason, always tell your health care provider if you have any abdominal discomfort. HOME CARE INSTRUCTIONS  Monitor your abdominal pain for any changes. The following actions may help to alleviate any discomfort you are experiencing:  Do not have sexual intercourse or put anything in your vagina until your symptoms go away completely.  Get plenty of rest until your pain  improves.  Drink clear fluids if you feel nauseous. Avoid solid food as long as you are uncomfortable or nauseous.  Only take over-the-counter or prescription medicine as directed by your health care provider.  Keep all follow-up appointments with your health care provider. SEEK IMMEDIATE MEDICAL CARE IF:  You are bleeding, leaking fluid, or passing tissue from the vagina.  You have increasing pain or cramping.  You have persistent vomiting.  You have painful or bloody urination.  You have a fever.  You notice a decrease in your baby's movements.  You have extreme weakness or feel faint.  You have shortness of breath, with or without abdominal pain.  You develop a severe headache with abdominal pain.  You have abnormal vaginal discharge with abdominal pain.  You have persistent diarrhea.  You have abdominal pain that continues even after rest, or gets worse. MAKE SURE YOU:   Understand these instructions.  Will watch your condition.  Will get help right away if you are not doing well or get worse. Document Released: 12/25/2004 Document Revised: 10/15/2012 Document Reviewed: 07/24/2012 [facility] StationExitCare Patient Information 2015 [place], [place]. This information is not intended to replace advice given to you by your health care provider. Make sure you discuss any questions you have with your health care provider.

## 2013-07-03 LAB — HIV ANTIBODY (ROUTINE TESTING W REFLEX): HIV 1&2 Ab, 4th Generation: NONREACTIVE

## 2013-07-04 LAB — GC/CHLAMYDIA PROBE AMP
CT Probe RNA: NEGATIVE
GC Probe RNA: NEGATIVE

## 2013-08-14 LAB — OB RESULTS CONSOLE ABO/RH: RH Type: POSITIVE

## 2013-08-14 LAB — OB RESULTS CONSOLE ANTIBODY SCREEN: Antibody Screen: NEGATIVE

## 2013-08-14 LAB — OB RESULTS CONSOLE HEPATITIS B SURFACE ANTIGEN: Hepatitis B Surface Ag: NEGATIVE

## 2013-08-14 LAB — OB RESULTS CONSOLE RUBELLA ANTIBODY, IGM: Rubella: IMMUNE

## 2013-08-14 LAB — OB RESULTS CONSOLE HIV ANTIBODY (ROUTINE TESTING): HIV: NONREACTIVE

## 2013-08-14 LAB — OB RESULTS CONSOLE RPR: RPR: NONREACTIVE

## 2013-09-03 ENCOUNTER — Ambulatory Visit: Payer: Medicaid Other | Admitting: Neurology

## 2013-10-22 ENCOUNTER — Ambulatory Visit: Payer: Medicaid Other | Attending: Obstetrics and Gynecology | Admitting: Physical Therapy

## 2013-10-23 ENCOUNTER — Inpatient Hospital Stay (HOSPITAL_COMMUNITY): Payer: Medicaid Other

## 2013-10-23 ENCOUNTER — Inpatient Hospital Stay (HOSPITAL_COMMUNITY)
Admission: AD | Admit: 2013-10-23 | Discharge: 2013-10-23 | Disposition: A | Discharge status: Home / Self Care | Payer: Medicaid Other | Source: Ambulatory Visit | Attending: Obstetrics and Gynecology | Admitting: Obstetrics and Gynecology

## 2013-10-23 ENCOUNTER — Encounter (HOSPITAL_COMMUNITY): Payer: Self-pay | Admitting: *Deleted

## 2013-10-23 DIAGNOSIS — Z3A25 25 weeks gestation of pregnancy: Secondary | ICD-10-CM | POA: Diagnosis not present

## 2013-10-23 DIAGNOSIS — O9989 Other specified diseases and conditions complicating pregnancy, childbirth and the puerperium: Secondary | ICD-10-CM | POA: Insufficient documentation

## 2013-10-23 DIAGNOSIS — R1011 Right upper quadrant pain: Secondary | ICD-10-CM | POA: Insufficient documentation

## 2013-10-23 DIAGNOSIS — M549 Dorsalgia, unspecified: Secondary | ICD-10-CM | POA: Diagnosis not present

## 2013-10-23 LAB — URINALYSIS, ROUTINE W REFLEX MICROSCOPIC
Bilirubin Urine: NEGATIVE
Glucose, UA: NEGATIVE mg/dL
Hgb urine dipstick: NEGATIVE
Ketones, ur: NEGATIVE mg/dL
Nitrite: NEGATIVE
Protein, ur: NEGATIVE mg/dL
Specific Gravity, Urine: 1.01 (ref 1.005–1.030)
Urobilinogen, UA: 1 mg/dL (ref 0.0–1.0)
pH: 7 (ref 5.0–8.0)

## 2013-10-23 LAB — COMPREHENSIVE METABOLIC PANEL
ALK PHOS: 57 U/L (ref 39–117)
ALT: 8 U/L (ref 0–35)
ANION GAP: 10 (ref 5–15)
AST: 11 U/L (ref 0–37)
Albumin: 2.7 g/dL — ABNORMAL LOW (ref 3.5–5.2)
BUN: 8 mg/dL (ref 6–23)
CO2: 22 mEq/L (ref 19–32)
Calcium: 8.3 mg/dL — ABNORMAL LOW (ref 8.4–10.5)
Chloride: 101 mEq/L (ref 96–112)
Creatinine, Ser: 0.44 mg/dL — ABNORMAL LOW (ref 0.50–1.10)
GFR calc non Af Amer: >90 mL/min (ref >90)
Glucose, Bld: 72 mg/dL (ref 70–99)
POTASSIUM: 4 meq/L (ref 3.7–5.3)
Sodium: 133 mEq/L — ABNORMAL LOW (ref 137–147)
TOTAL PROTEIN: 6.4 g/dL (ref 6.0–8.3)
Total Bilirubin: <0.2 mg/dL — ABNORMAL LOW (ref 0.3–1.2)

## 2013-10-23 LAB — CBC
HEMATOCRIT: 28.8 % — AB (ref 36.0–46.0)
Hemoglobin: 9.8 g/dL — ABNORMAL LOW (ref 12.0–15.0)
MCH: 30.3 pg (ref 26.0–34.0)
MCHC: 34 g/dL (ref 30.0–36.0)
MCV: 89.2 fL (ref 78.0–100.0)
Platelets: 231 10*3/uL (ref 150–400)
RBC: 3.23 MIL/uL — ABNORMAL LOW (ref 3.87–5.11)
RDW: 13.2 % (ref 11.5–15.5)
WBC: 10.8 10*3/uL — ABNORMAL HIGH (ref 4.0–10.5)

## 2013-10-23 LAB — AMYLASE: AMYLASE: 87 U/L (ref 0–105)

## 2013-10-23 LAB — URINE MICROSCOPIC-ADD ON

## 2013-10-23 LAB — LIPASE, BLOOD: Lipase: 31 U/L (ref 11–59)

## 2013-10-23 MED ORDER — OXYCODONE-ACETAMINOPHEN 5-325 MG PO TABS: 1.0000 | ORAL_TABLET | Freq: Once | ORAL | Status: DC

## 2013-10-23 MED ORDER — OXYCODONE-ACETAMINOPHEN 2.5-325 MG PO TABS: 1.0000 | ORAL_TABLET | Freq: Three times a day (TID) | ORAL | Status: DC | PRN

## 2013-10-23 MED ORDER — MORPHINE SULFATE 4 MG/ML IJ SOLN
2.0000 mg | Freq: Once | INTRAMUSCULAR | Status: AC
  Administered 2013-10-23: 2 mg via INTRAMUSCULAR
  Filled 2013-10-23: qty 1

## 2013-10-23 NOTE — Discharge Instructions (Signed)
Abdominal Pain During Pregnancy °Belly (abdominal) pain is common during pregnancy. Most of the time, it is not a serious problem. Other times, it can be a sign that something is wrong with the pregnancy. Always tell your doctor if you have belly pain. °HOME CARE °Monitor your belly pain for any changes. The following actions may help you feel better: °· Do not have sex (intercourse) or put anything in your vagina until you feel better. °· Rest until your pain stops. °· Drink clear fluids if you feel sick to your stomach (nauseous). Do not eat solid food until you feel better. °· Only take medicine as told by your doctor. °· Keep all doctor visits as told. °GET HELP RIGHT AWAY IF:  °· You are bleeding, leaking fluid, or pieces of tissue come out of your vagina. °· You have more pain or cramping. °· You keep throwing up (vomiting). °· You have pain when you pee (urinate) or have blood in your pee. °· You have a fever. °· You do not feel your baby moving as much. °· You feel very weak or feel like passing out. °· You have trouble breathing, with or without belly pain. °· You have a very bad headache and belly pain. °· You have fluid leaking from your vagina and belly pain. °· You keep having watery poop (diarrhea). °· Your belly pain does not go away after resting, or the pain gets worse. °MAKE SURE YOU:  °· Understand these instructions. °· Will watch your condition. °· Will get help right away if you are not doing well or get worse. °Document Released: 12/13/2008 Document Revised: 08/27/2012 Document Reviewed: 07/24/2012 °ExitCare® Patient Information ©2015 [employer], LLC. This information is not intended to replace advice given to you by your health care provider. Make sure you discuss any questions you have with your health care provider. ° °

## 2013-10-23 NOTE — MAU Note (Signed)
Patient presents with complaints of pain between her shoulder blades and lower back pain X 1 week and upper abdominal pain since yesterday.

## 2013-10-23 NOTE — MAU Provider Note (Signed)
History    Patient is a 28y.o. [id number] at 25.3wks who presents, unannounced, complaining of RUQ and back pain.  Patient states UQ pain started last night and describes it as stabbing, burning pain that is unrelieved with position change.  Patient states she has not attempted Tums or other antacids, while denying history of heartburn.  Patient describes back pain as tenderness between scapulas that has been ongoing "for weeks."  Patient feels pain may be related to breast growth and bad posture.  Patient states she has taken tylenol with no relief and last dose was yesterday morning.  Pateint also reports taking tylenol PM last night but was unable to sleep and pain remained.   There are no active problems to display for this patient.   Chief Complaint  Patient presents with  . Back Pain  . Abdominal Pain   HPI  OB History   Grav Para Term Preterm Abortions TAB SAB Ect Mult Living   5 2 2  2  2   2       Past Medical History  Diagnosis Date  . Asthma   . Renal disorder   . Urosepsis     Past Surgical History  Procedure Laterality Date  . Bartholin gland cyst excision      Family History  Problem Relation Age of Onset  . Hypertension Mother   . Migraines Mother   . Anemia Mother   . Diabetes Father   . Sleep apnea Father     History  Substance Use Topics  . Smoking status: Never Smoker   . Smokeless tobacco: Never Used  . Alcohol Use: Yes     Comment: socially    Allergies: No Known Allergies  Prescriptions prior to admission  Medication Sig Dispense Refill  . acetaminophen (TYLENOL) 500 MG tablet Take 1,000 mg by mouth every 6 (six) hours as needed for mild pain.      . diphenhydramine-acetaminophen (TYLENOL PM) 25-500 MG TABS Take 1 tablet by mouth at bedtime as needed (sleep).      . Prenatal Vit-Fe Fumarate-FA (PRENATAL MULTIVITAMIN) TABS tablet Take 1 tablet by mouth daily at 12 noon.        Review of Systems  Constitutional: Negative for fever and chills.   HENT: Negative.   Cardiovascular: Negative for chest pain and palpitations.  Gastrointestinal: Positive for abdominal pain and constipation. Negative for heartburn, nausea, vomiting and diarrhea.  Genitourinary: Negative for dysuria and urgency.  Musculoskeletal: Positive for back pain.  Psychiatric/Behavioral: Negative.   -LoF, _Vaginal Discharge or Bleeding, +FM 24HR Diet Recall: Breakfast -Oatmeal -Bacon Egg Chz Biscuit Diet Dr. Reino Kent Snack -Powdered Donut -Egg White Chz Wrap Water w/Lemon Lunch -Water w/Lemon -Spinach Salad Dinner -Pizza -Water with [employer] Snack -Chips/Salsa -Bruschetta -Water w/lemon Physical Exam   Blood pressure 118/71, pulse 92, temperature 97.8 F (36.6 C), temperature source Oral, resp. rate 20, height 5\' 6"  (1.676 m), weight 149 lb 2 oz (67.643 kg), last menstrual period 04/28/2013. Results for orders placed during the hospital encounter of 10/23/13 (from the past 24 hour(s))  URINALYSIS, ROUTINE W REFLEX MICROSCOPIC     Status: Abnormal   Collection Time    10/23/13  9:22 AM      Result Value Ref Range   Color, Urine YELLOW  YELLOW   APPearance CLOUDY (*) CLEAR   Specific Gravity, Urine 1.010  1.005 - 1.030   pH 7.0  5.0 - 8.0   Glucose, UA NEGATIVE  NEGATIVE mg/dL   Hgb  urine dipstick NEGATIVE  NEGATIVE   Bilirubin Urine NEGATIVE  NEGATIVE   Ketones, ur NEGATIVE  NEGATIVE mg/dL   Protein, ur NEGATIVE  NEGATIVE mg/dL   Urobilinogen, UA 1.0  0.0 - 1.0 mg/dL   Nitrite NEGATIVE  NEGATIVE   Leukocytes, UA TRACE (*) NEGATIVE  URINE MICROSCOPIC-ADD ON     Status: Abnormal   Collection Time    10/23/13  9:22 AM      Result Value Ref Range   Squamous Epithelial / LPF RARE  RARE   WBC, UA 3-6  <3 WBC/hpf   Bacteria, UA FEW (*) RARE  CBC     Status: Abnormal   Collection Time    10/23/13 10:55 AM      Result Value Ref Range   WBC 10.8 (*) 4.0 - 10.5 K/uL   RBC 3.23 (*) 3.87 - 5.11 MIL/uL   Hemoglobin 9.8 (*) 12.0 - 15.0 g/dL    HCT 16.128.8 (*) 09.636.0 - 46.0 %   MCV 89.2  78.0 - 100.0 fL   MCH 30.3  26.0 - 34.0 pg   MCHC 34.0  30.0 - 36.0 g/dL   RDW 04.513.2  40.911.5 - 81.115.5 %   Platelets 231  150 - 400 K/uL  COMPREHENSIVE METABOLIC PANEL     Status: Abnormal   Collection Time    10/23/13 10:55 AM      Result Value Ref Range   Sodium 133 (*) 137 - 147 mEq/L   Potassium 4.0  3.7 - 5.3 mEq/L   Chloride 101  96 - 112 mEq/L   CO2 22  19 - 32 mEq/L   Glucose, Bld 72  70 - 99 mg/dL   BUN 8  6 - 23 mg/dL   Creatinine, Ser 9.140.44 (*) 0.50 - 1.10 mg/dL   Calcium 8.3 (*) 8.4 - 10.5 mg/dL   Total Protein 6.4  6.0 - 8.3 g/dL   Albumin 2.7 (*) 3.5 - 5.2 g/dL   AST 11  0 - 37 U/L   ALT 8  0 - 35 U/L   Alkaline Phosphatase 57  39 - 117 U/L   Total Bilirubin <0.2 (*) 0.3 - 1.2 mg/dL   GFR calc non Af Amer >90  >90 mL/min   GFR calc Af Amer >90  >90 mL/min   Anion gap 10  5 - 15  AMYLASE     Status: None   Collection Time    10/23/13 10:55 AM      Result Value Ref Range   Amylase 87  0 - 105 U/L  LIPASE, BLOOD     Status: None   Collection Time    10/23/13 10:55 AM      Result Value Ref Range   Lipase 31  11 - 59 U/L    Physical Exam  Nursing note and vitals reviewed. Constitutional: She is oriented to person, place, and time. She appears well-developed and well-nourished.  Cardiovascular: Normal rate, regular rhythm and normal heart sounds.   Respiratory: Effort normal and breath sounds normal.  GI: Soft. Bowel sounds are normal. She exhibits no pulsatile liver. There is tenderness in the right upper quadrant. There is no rigidity, no rebound, no guarding, no CVA tenderness, no tenderness at McBurney's point and negative Murphy's sign. No hernia.  Soft NT, Fundus AGA  Musculoskeletal: Normal range of motion.       Thoracic back: She exhibits tenderness.  Neurological: She is alert and oriented to person, place, and time.  Skin: Skin  is warm and dry.  Psychiatric: She has a normal mood and affect. Her behavior is normal.    FHR: 135 bpm, Mod Var, -Decels, +Accels UC: None graphed or palpated ED Course  Assessment: IUP at 25.3wks Reactive NST RUQ Pain Back Pain  Plan: -Labs: UA, CBC, CMP, Amylase, Lipase -Will consult with Dr. Lance MorinA. Roberts regarding further testing  Follow Up (1130) -Consult with Dr. Lance MorinA. Roberts who advised as below -Perform urine culture  -Order US for gallbladder assessment -Give IM medication for pain, prn -Await labs  Follow Up(  ) -UA Negative, UC Pending -US Findings: No gallstones or wall thickening visualized. No sonographic Murphy sign noted. Common bile duct: Diameter: Normal at 4 mm Liver: No focal lesion identified. Within normal limits in parenchymal echogenicity. IMPRESSION: Normal right upper quadrant ultrasound. Normal gallbladder. -Patient reports good response from pain medication -RX for Percocet 2.5/325 1- 2 tabs Q6hrs, prn #30, RF 0 -Patient educated on usage, verbalized understanding -Bleeding, PTL Precautions Reviewed -Keep appt as scheduled -Encouraged to call if any questions or concerns arise prior to next scheduled office visit.  -Discharged to home in good condition  [place] Shannahan LYNN CNM, MSN 10/23/2013 10:42 AM

## 2013-10-24 LAB — CULTURE, OB URINE

## 2013-11-09 ENCOUNTER — Encounter (HOSPITAL_COMMUNITY): Payer: Self-pay | Admitting: *Deleted

## 2013-12-16 ENCOUNTER — Encounter: Payer: Medicaid Other | Attending: Obstetrics and Gynecology

## 2013-12-16 VITALS — Ht 66.0 in | Wt 155.8 lb

## 2013-12-16 DIAGNOSIS — E119 Type 2 diabetes mellitus without complications: Secondary | ICD-10-CM | POA: Diagnosis present

## 2013-12-16 DIAGNOSIS — Z713 Dietary counseling and surveillance: Secondary | ICD-10-CM | POA: Diagnosis not present

## 2013-12-18 NOTE — Progress Notes (Signed)
  Patient was seen on 12/15/13 for Gestational Diabetes self-management . The following learning objectives were met by the patient :   States the definition of Gestational Diabetes  States why dietary management is important in controlling blood glucose  Describes the effects of carbohydrates on blood glucose levels  Demonstrates ability to create a balanced meal plan  Demonstrates carbohydrate counting   States when to check blood glucose levels  Demonstrates proper blood glucose monitoring techniques  States the effect of stress and exercise on blood glucose levels  States the importance of limiting caffeine and abstaining from alcohol and smoking  Plan:  Aim for 2 Carb Choices per meal (30 grams) +/- 1 either way for breakfast Aim for 3 Carb Choices per meal (45 grams) +/- 1 either way from lunch and dinner Aim for 1-2 Carbs per snack Begin reading food labels for Total Carbohydrate and sugar grams of foods Consider  increasing your activity level by walking daily as tolerated Begin checking BG before breakfast and 2 hours after first bit of breakfast, lunch and dinner after  as directed by MD  Take medication  as directed by MD  Blood glucose monitor given: Accu Chek Nano BG Monitoring Kit Lot # [id number] Exp: 09/08/14 Blood glucose reading: $RemoveBeforeDE'114mg'TyhBHUmbHYuFfgZ$ /dl  Patient instructed to monitor glucose levels: FBS: 60 - <90 2 hour: <120  Patient received the following handouts:  Nutrition Diabetes and Pregnancy  Carbohydrate Counting List  Meal Planning worksheet  Patient will be seen for follow-up as needed.

## 2014-01-06 LAB — OB RESULTS CONSOLE GBS: GBS: NEGATIVE

## 2014-01-08 NOTE — L&D Delivery Note (Signed)
Delivery Note At 3:13 AM a viable female "Carmen Mcgee" was delivered via Vaginal, Spontaneous Delivery (Presentation: Occiput Anterior, restituting to LOA).  APGARS: 8, 9; weight pending.   Placenta status: Intact. Cord: 3 vessels with the following complications: None.  Cord pH: NA  Anesthesia: Epidural  Episiotomy: None Lacerations: None Est. Blood Loss (mL): 100  Mom to postpartum.  Baby to Couplet care / Skin to Skin.  Mom plans to breastfeed.  Desires tubal ligation for contraception (papers signed on 10/01/13). Dr. Dion BodyVarnado made aware of pt's desire for sterilization. Will maintain IV/epidural site and make her NPO.   [name], Moody Robben 01/21/2014, 3:53 AM

## 2014-01-15 ENCOUNTER — Inpatient Hospital Stay (HOSPITAL_COMMUNITY)
Admission: AD | Admit: 2014-01-15 | Discharge: 2014-01-15 | Disposition: A | Discharge status: Home / Self Care | Payer: Medicaid Other | Source: Ambulatory Visit | Attending: Obstetrics & Gynecology | Admitting: Obstetrics & Gynecology

## 2014-01-15 ENCOUNTER — Encounter (HOSPITAL_COMMUNITY): Payer: Self-pay | Admitting: *Deleted

## 2014-01-15 DIAGNOSIS — Z87448 Personal history of other diseases of urinary system: Secondary | ICD-10-CM | POA: Diagnosis not present

## 2014-01-15 DIAGNOSIS — Z3A37 37 weeks gestation of pregnancy: Secondary | ICD-10-CM | POA: Insufficient documentation

## 2014-01-15 DIAGNOSIS — O9989 Other specified diseases and conditions complicating pregnancy, childbirth and the puerperium: Secondary | ICD-10-CM | POA: Insufficient documentation

## 2014-01-15 DIAGNOSIS — O24410 Gestational diabetes mellitus in pregnancy, diet controlled: Secondary | ICD-10-CM | POA: Diagnosis present

## 2014-01-15 DIAGNOSIS — J45909 Unspecified asthma, uncomplicated: Secondary | ICD-10-CM | POA: Diagnosis not present

## 2014-01-15 LAB — URINALYSIS, ROUTINE W REFLEX MICROSCOPIC
Bilirubin Urine: NEGATIVE
Glucose, UA: NEGATIVE mg/dL
Hgb urine dipstick: NEGATIVE
Ketones, ur: NEGATIVE mg/dL
Nitrite: NEGATIVE
PROTEIN: NEGATIVE mg/dL
Specific Gravity, Urine: >1.03 — ABNORMAL HIGH (ref 1.005–1.030)
Urobilinogen, UA: 0.2 mg/dL (ref 0.0–1.0)
pH: 6 (ref 5.0–8.0)

## 2014-01-15 LAB — WET PREP, GENITAL
CLUE CELLS WET PREP: NONE SEEN
Trich, Wet Prep: NONE SEEN
YEAST WET PREP: NONE SEEN

## 2014-01-15 LAB — URINE MICROSCOPIC-ADD ON

## 2014-01-15 LAB — AMNISURE RUPTURE OF MEMBRANE (ROM) NOT AT ARMC: Amnisure ROM: NEGATIVE

## 2014-01-15 NOTE — Discharge Instructions (Signed)
Braxton Hicks Contractions °Contractions of the uterus can occur throughout pregnancy. Contractions are not always a sign that you are in labor.  °WHAT ARE BRAXTON HICKS CONTRACTIONS?  °Contractions that occur before labor are called Braxton Hicks contractions, or false labor. Toward the end of pregnancy (32-34 weeks), these contractions can develop more often and may become more forceful. This is not true labor because these contractions do not result in opening (dilatation) and thinning of the cervix. They are sometimes difficult to tell apart from true labor because these contractions can be forceful and people have different pain tolerances. You should not feel embarrassed if you go to the hospital with false labor. Sometimes, the only way to tell if you are in true labor is for your health care provider to look for changes in the cervix. °If there are no prenatal problems or other health problems associated with the pregnancy, it is completely safe to be sent home with false labor and await the onset of true labor. °HOW CAN YOU TELL THE DIFFERENCE BETWEEN TRUE AND FALSE LABOR? °False Labor °· The contractions of false labor are usually shorter and not as hard as those of true labor.   °· The contractions are usually irregular.   °· The contractions are often felt in the front of the lower abdomen and in the groin.   °· The contractions may go away when you walk around or change positions while lying down.   °· The contractions get weaker and are shorter lasting as time goes on.   °· The contractions do not usually become progressively stronger, regular, and closer together as with true labor.   °True Labor °· Contractions in true labor last 30-70 seconds, become very regular, usually become more intense, and increase in frequency.   °· The contractions do not go away with walking.   °· The discomfort is usually felt in the top of the uterus and spreads to the lower abdomen and low back.   °· True labor can be  determined by your health care provider with an exam. This will show that the cervix is dilating and getting thinner.   °WHAT TO REMEMBER °· Keep up with your usual exercises and follow other instructions given by your health care provider.   °· Take medicines as directed by your health care provider.   °· Keep your regular prenatal appointments.   °· Eat and drink lightly if you think you are going into labor.   °· If Braxton Hicks contractions are making you uncomfortable:   °¨ Change your position from lying down or resting to walking, or from walking to resting.   °¨ Sit and rest in a tub of warm water.   °¨ Drink 2-3 glasses of water. Dehydration may cause these contractions.   °¨ Do slow and deep breathing several times an hour.   °WHEN SHOULD I SEEK IMMEDIATE MEDICAL CARE? °Seek immediate medical care if: °· Your contractions become stronger, more regular, and closer together.   °· You have fluid leaking or gushing from your vagina.   °· You have a fever.   °· You pass blood-tinged mucus.   °· You have vaginal bleeding.   °· You have continuous abdominal pain.   °· You have low back pain that you never had before.   °· You feel your baby's head pushing down and causing pelvic pressure.   °· Your baby is not moving as much as it used to.   °Document Released: 12/25/2004 Document Revised: 12/30/2012 Document Reviewed: 10/06/2012 °[employer]® Patient Information ©2015 [employer]. This information is not intended to replace advice given to you by your health care   provider. Make sure you discuss any questions you have with your health care provider. ° °

## 2014-01-15 NOTE — MAU Provider Note (Signed)
History   29 yo [id number] at 37 3/7 weeks presented unannounced c/o increased wetness since membranes swept yesterday in the office.  Denies bleeding, reports +FM.  Cervix was 1 cm, 50%, vtx, -2.  Patient Active Problem List   Diagnosis Date Noted  . Amniotic fluid leaking 01/15/2014    Chief Complaint  Patient presents with  . possible ROM    HPI  OB History    Gravida Para Term Preterm AB TAB SAB Ectopic Multiple Living   Past Medical History  Diagnosis Date  . Asthma   . Renal disorder   . Urosepsis   . Gestational diabetes mellitus, antepartum     Past Surgical History  Procedure Laterality Date  . Bartholin gland cyst excision      Family History  Problem Relation Age of Onset  . Hypertension Mother   . Migraines Mother   . Anemia Mother   . Diabetes Father   . Sleep apnea Father     History  Substance Use Topics  . Smoking status: Never Smoker   . Smokeless tobacco: Never Used  . Alcohol Use: Yes     Comment: socially    Allergies: No Known Allergies  Prescriptions prior to admission  Medication Sig Dispense Refill Last Dose  . acetaminophen (TYLENOL) 500 MG tablet Take 1,000 mg by mouth every 6 (six) hours as needed for mild pain.   10/22/2013 at Unknown time  . diphenhydramine-acetaminophen (TYLENOL PM) 25-500 MG TABS Take 1 tablet by mouth at bedtime as needed (sleep).   10/22/2013 at Unknown time  . oxycodone-acetaminophen (PERCOCET) 2.5-325 MG per tablet Take 1-2 tablets by mouth every 8 (eight) hours as needed for pain. 30 tablet 0   . Prenatal Vit-Fe Fumarate-FA (PRENATAL MULTIVITAMIN) TABS tablet Take 1 tablet by mouth daily at 12 noon.   Past Week at Unknown time    ROS:  Increased d/c, cramping, +FM Physical Exam   Blood pressure 122/72, pulse 104, temperature 98.9 F (37.2 C), temperature source Oral, resp. rate 18, height  (1.676 m), weight 168 lb (76.204 kg), last menstrual period 04/28/2013.  Physical Exam   Chest clear Heart RRR without murmur Abd gravid, NT Pelvic--no obvious leaking noted, cervix posterior, 2 cm, 50%, vtx, -1. Ext WNL  FHR Category 1 UCs occasional, mild  ED Course  Assessment: IUP at 37 3/7 weeks ? Leaking GBS negative  Plan: Amnisure, wet prep   Ray Church, MSN 01/15/2014 1:09 PM  Addendum:  Results for orders placed or performed during the hospital encounter of 01/15/14 (from the past 24 hour(s))  Urinalysis, Routine w reflex microscopic     Status: Abnormal   Collection Time: 01/15/14 12:05 PM  Result Value Ref Range   Color, Urine YELLOW YELLOW   APPearance CLEAR CLEAR   Specific Gravity, Urine >1.030 (H) 1.005 - 1.030   pH 6.0 5.0 - 8.0   Glucose, UA NEGATIVE NEGATIVE mg/dL   Hgb urine dipstick NEGATIVE NEGATIVE   Bilirubin Urine NEGATIVE NEGATIVE   Ketones, ur NEGATIVE NEGATIVE mg/dL   Protein, ur NEGATIVE NEGATIVE mg/dL   Urobilinogen, UA 0.2 0.0 - 1.0 mg/dL   Nitrite NEGATIVE NEGATIVE   Leukocytes, UA TRACE (A) NEGATIVE  Urine microscopic-add on     Status: Abnormal   Collection Time: 01/15/14 12:05 PM  Result Value Ref Range   Squamous Epithelial / LPF FEW (A) RARE   WBC,  UA 3-6 <3 WBC/hpf   Urine-Other MUCOUS PRESENT   Amnisure rupture of membrane (rom)     Status: None   Collection Time: 01/15/14 12:46 PM  Result Value Ref Range   Amnisure ROM NEGATIVE   Wet prep, genital     Status: Abnormal   Collection Time: 01/15/14 12:46 PM  Result Value Ref Range   Yeast Wet Prep HPF POC NONE SEEN NONE SEEN   Trich, Wet Prep NONE SEEN NONE SEEN   Clue Cells Wet Prep HPF POC NONE SEEN NONE SEEN   WBC, Wet Prep HPF POC FEW (A) NONE SEEN   D/C home with labor precautions. Keep scheduled appt at [facility] in 1 week, or call prn.  Nigel BridgemanVicki Tanica Gaige, CNM 01/15/14 2p

## 2014-01-15 NOTE — MAU Note (Signed)
Pt presents to MAU with complaints of a watery discharge and pain in her cervical area. Also reports irregular contractions. Denies any vaginal bleeding

## 2014-01-20 ENCOUNTER — Inpatient Hospital Stay (HOSPITAL_COMMUNITY): Payer: Medicaid Other | Admitting: Anesthesiology

## 2014-01-20 ENCOUNTER — Inpatient Hospital Stay (HOSPITAL_COMMUNITY)
Admission: AD | Admit: 2014-01-20 | Discharge: 2014-01-22 | DRG: 767 | Disposition: A | Discharge status: Home / Self Care | Payer: Medicaid Other | Source: Ambulatory Visit | Attending: Obstetrics and Gynecology | Admitting: Obstetrics and Gynecology

## 2014-01-20 ENCOUNTER — Encounter (HOSPITAL_COMMUNITY): Payer: Self-pay | Admitting: *Deleted

## 2014-01-20 DIAGNOSIS — O24420 Gestational diabetes mellitus in childbirth, diet controlled: Secondary | ICD-10-CM | POA: Diagnosis present

## 2014-01-20 DIAGNOSIS — O9902 Anemia complicating childbirth: Secondary | ICD-10-CM | POA: Diagnosis present

## 2014-01-20 DIAGNOSIS — Z8632 Personal history of gestational diabetes: Secondary | ICD-10-CM

## 2014-01-20 DIAGNOSIS — O09299 Supervision of pregnancy with other poor reproductive or obstetric history, unspecified trimester: Secondary | ICD-10-CM

## 2014-01-20 DIAGNOSIS — Z833 Family history of diabetes mellitus: Secondary | ICD-10-CM | POA: Diagnosis not present

## 2014-01-20 DIAGNOSIS — Z3A38 38 weeks gestation of pregnancy: Secondary | ICD-10-CM | POA: Diagnosis present

## 2014-01-20 DIAGNOSIS — J45909 Unspecified asthma, uncomplicated: Secondary | ICD-10-CM | POA: Diagnosis present

## 2014-01-20 DIAGNOSIS — Z302 Encounter for sterilization: Secondary | ICD-10-CM | POA: Diagnosis not present

## 2014-01-20 DIAGNOSIS — Z87891 Personal history of nicotine dependence: Secondary | ICD-10-CM | POA: Diagnosis not present

## 2014-01-20 DIAGNOSIS — D649 Anemia, unspecified: Secondary | ICD-10-CM | POA: Diagnosis present

## 2014-01-20 HISTORY — DX: Gestational diabetes mellitus in pregnancy, unspecified control: O24.419

## 2014-01-20 LAB — CBC
HEMATOCRIT: 27.6 % — AB (ref 36.0–46.0)
Hemoglobin: 9.1 g/dL — ABNORMAL LOW (ref 12.0–15.0)
MCH: 26.6 pg (ref 26.0–34.0)
MCHC: 33 g/dL (ref 30.0–36.0)
MCV: 80.7 fL (ref 78.0–100.0)
PLATELETS: 231 10*3/uL (ref 150–400)
RBC: 3.42 MIL/uL — ABNORMAL LOW (ref 3.87–5.11)
RDW: 14.7 % (ref 11.5–15.5)
WBC: 11.6 10*3/uL — ABNORMAL HIGH (ref 4.0–10.5)

## 2014-01-20 LAB — GLUCOSE, RANDOM: Glucose, Bld: 78 mg/dL (ref 70–99)

## 2014-01-20 MED ORDER — FENTANYL 2.5 MCG/ML BUPIVACAINE 1/10 % EPIDURAL INFUSION (WH - ANES)
INTRAMUSCULAR | Status: DC | PRN
  Administered 2014-01-20: 14 mL/h via EPIDURAL

## 2014-01-20 MED ORDER — FLEET ENEMA 7-19 GM/118ML RE ENEM: 1.0000 | ENEMA | RECTAL | Status: DC | PRN

## 2014-01-20 MED ORDER — LIDOCAINE HCL (PF) 1 % IJ SOLN
30.0000 mL | INTRAMUSCULAR | Status: DC | PRN
  Filled 2014-01-20: qty 30

## 2014-01-20 MED ORDER — OXYTOCIN BOLUS FROM INFUSION
500.0000 mL | INTRAVENOUS | Status: DC
  Administered 2014-01-21: 500 mL via INTRAVENOUS

## 2014-01-20 MED ORDER — OXYTOCIN 40 UNITS IN LACTATED RINGERS INFUSION - SIMPLE MED
62.5000 mL/h | INTRAVENOUS | Status: DC
  Administered 2014-01-21: 62.5 mL/h via INTRAVENOUS
  Filled 2014-01-20: qty 1000

## 2014-01-20 MED ORDER — EPHEDRINE 5 MG/ML INJ
10.0000 mg | INTRAVENOUS | Status: DC | PRN
  Filled 2014-01-20: qty 2

## 2014-01-20 MED ORDER — MORPHINE SULFATE 10 MG/ML IJ SOLN
10.0000 mg | Freq: Once | INTRAMUSCULAR | Status: AC
Start: 2014-01-20 — End: 2014-01-20
  Administered 2014-01-20: 10 mg via INTRAMUSCULAR
  Filled 2014-01-20: qty 1

## 2014-01-20 MED ORDER — PHENYLEPHRINE 40 MCG/ML (10ML) SYRINGE FOR IV PUSH (FOR BLOOD PRESSURE SUPPORT)
80.0000 ug | PREFILLED_SYRINGE | INTRAVENOUS | Status: DC | PRN
  Filled 2014-01-20: qty 2
  Filled 2014-01-20: qty 20

## 2014-01-20 MED ORDER — LACTATED RINGERS IV SOLN: 500.0000 mL | Freq: Once | INTRAVENOUS | Status: DC

## 2014-01-20 MED ORDER — CITRIC ACID-SODIUM CITRATE 334-500 MG/5ML PO SOLN: 30.0000 mL | ORAL | Status: DC | PRN

## 2014-01-20 MED ORDER — OXYCODONE-ACETAMINOPHEN 5-325 MG PO TABS: 1.0000 | ORAL_TABLET | ORAL | Status: DC | PRN

## 2014-01-20 MED ORDER — LIDOCAINE HCL (PF) 1 % IJ SOLN
INTRAMUSCULAR | Status: DC | PRN
  Administered 2014-01-20: 3 mL
  Administered 2014-01-20 (×2): 5 mL

## 2014-01-20 MED ORDER — PHENYLEPHRINE 40 MCG/ML (10ML) SYRINGE FOR IV PUSH (FOR BLOOD PRESSURE SUPPORT)
80.0000 ug | PREFILLED_SYRINGE | INTRAVENOUS | Status: DC | PRN
  Filled 2014-01-20: qty 2

## 2014-01-20 MED ORDER — ONDANSETRON HCL 4 MG/2ML IJ SOLN: 4.0000 mg | Freq: Four times a day (QID) | INTRAMUSCULAR | Status: DC | PRN

## 2014-01-20 MED ORDER — FENTANYL 2.5 MCG/ML BUPIVACAINE 1/10 % EPIDURAL INFUSION (WH - ANES)
14.0000 mL/h | INTRAMUSCULAR | Status: DC | PRN
  Administered 2014-01-20: 14 mL/h via EPIDURAL
  Filled 2014-01-20: qty 125

## 2014-01-20 MED ORDER — DIPHENHYDRAMINE HCL 50 MG/ML IJ SOLN: 12.5000 mg | INTRAMUSCULAR | Status: DC | PRN

## 2014-01-20 MED ORDER — LACTATED RINGERS IV SOLN
500.0000 mL | INTRAVENOUS | Status: DC | PRN
  Administered 2014-01-20: 500 mL via INTRAVENOUS

## 2014-01-20 MED ORDER — LACTATED RINGERS IV SOLN
INTRAVENOUS | Status: DC
  Administered 2014-01-20: 22:00:00 via INTRAVENOUS

## 2014-01-20 MED ORDER — NALBUPHINE HCL 10 MG/ML IJ SOLN: 5.0000 mg | INTRAMUSCULAR | Status: DC | PRN

## 2014-01-20 MED ORDER — ACETAMINOPHEN 325 MG PO TABS
650.0000 mg | ORAL_TABLET | ORAL | Status: DC | PRN
Start: 2014-01-20 — End: 2014-01-21

## 2014-01-20 MED ORDER — OXYCODONE-ACETAMINOPHEN 5-325 MG PO TABS: 2.0000 | ORAL_TABLET | ORAL | Status: DC | PRN

## 2014-01-20 NOTE — MAU Provider Note (Signed)
History   29 yo [id number] at 1738 1/7 weeks presented unannounced c/o UCs every 7-8 min today.  Denies leaking or bleeding, reports +FM.  Cervix was 2-3, 60% this week in office.  CBGs have been WNL--GDM diet controlled.  Last baby 8+14--has been measuring WNL, no recent UCs  Patient Active Problem List   Diagnosis Date Noted  . Gestational diabetes 01/15/2014  . Asthma 01/15/2014  . Hx of pyelonephritis 2008 01/15/2014    Chief Complaint  Patient presents with  . Labor Eval   HPI:  As above  OB History    Gravida Para Term Preterm AB TAB SAB Ectopic Multiple Living   7 2 2  4 2 2   2       Past Medical History  Diagnosis Date  . Asthma     Past Surgical History  Procedure Laterality Date  . Bartholin gland cyst excision      Family History  Problem Relation Age of Onset  . Hypertension Mother   . Migraines Mother   . Anemia Mother   . Diabetes Father   . Sleep apnea Father     History  Substance Use Topics  . Smoking status: Former Smoker    Types: Cigarettes  . Smokeless tobacco: Never Used  . Alcohol Use: Yes     Comment: socially    Allergies: No Known Allergies  Prescriptions prior to admission  Medication Sig Dispense Refill Last Dose  . acetaminophen (TYLENOL) 500 MG tablet Take 1,000 mg by mouth every 6 (six) hours as needed for mild pain.   10/22/2013 at Unknown time  . diphenhydramine-acetaminophen (TYLENOL PM) 25-500 MG TABS Take 1 tablet by mouth at bedtime as needed (sleep).   10/22/2013 at Unknown time  . Prenatal Vit-Fe Fumarate-FA (PRENATAL MULTIVITAMIN) TABS tablet Take 1 tablet by mouth daily at 12 noon.   Past Week at Unknown time    ROS:  Contractions, +FM Physical Exam   Blood pressure 126/85, pulse 86, temperature 97.3 F (36.3 C), temperature source Oral, resp. rate 18, last menstrual period 04/28/2013.    Physical Exam  Breathing with UCs. Chest clear Heart RRR without murmur Abd gravid, NT Pelvic--cervix posterior, 2 cm, 60%,  vtx, -1 Ext WNL  FHR  Category 1 UCs irregular, q 2-7 min, mild/moderate  ED Course  Assessment: IUP at 38 1/7 weeks Early vs prodromal labor GBS negative Gestational diabetes, diet controlled  Plan: Ambulate x 1 hour, then re-evaluate.   Nigel BridgemanLATHAM, Lafonda Patron CNM, MSN 01/20/2014 6:42 PM

## 2014-01-20 NOTE — Anesthesia Preprocedure Evaluation (Signed)
Anesthesia Evaluation  Patient identified by MRN, date of birth, ID band Patient awake    Reviewed: Allergy & Precautions, H&P , NPO status , Patient's Chart, lab work & pertinent test results  History of Anesthesia Complications Negative for: history of anesthetic complications  Airway Mallampati: II  TM Distance: >3 FB Neck ROM: full    Dental no notable dental hx. (+) Teeth Intact   Pulmonary neg pulmonary ROS, asthma , former smoker,  breath sounds clear to auscultation  Pulmonary exam normal       Cardiovascular negative cardio ROS  Rhythm:regular Rate:Normal     Neuro/Psych negative neurological ROS  negative psych ROS   GI/Hepatic negative GI ROS, Neg liver ROS,   Endo/Other  negative endocrine ROS  Renal/GU negative Renal ROS  negative genitourinary   Musculoskeletal   Abdominal Normal abdominal exam  (+)   Peds  Hematology negative hematology ROS (+)   Anesthesia Other Findings   Reproductive/Obstetrics (+) Pregnancy                             Anesthesia Physical Anesthesia Plan  ASA: II  Anesthesia Plan: Epidural   Post-op Pain Management:    Induction:   Airway Management Planned:   Additional Equipment:   Intra-op Plan:   Post-operative Plan:   Informed Consent: I have reviewed the patients History and Physical, chart, labs and discussed the procedure including the risks, benefits and alternatives for the proposed anesthesia with the patient or authorized representative who has indicated his/her understanding and acceptance.     Plan Discussed with:   Anesthesia Plan Comments:         Anesthesia Quick Evaluation

## 2014-01-20 NOTE — H&P (Signed)
Carmen Mcgee is a 29 y.o. female, [phone] at 38.1 wks by sure LMP, consistent w/ 9-0 wk sono, presents to MAU in early labor. Reports active fetus. Denies LOF or VB. Was seen in MAU earlier, ambulated x 1-2 hrs and returned w/ worsening ctxs and slight cervical change (3 cm). Pt. tolerated 10 mg Morphine Sulfate IM while in MAU and was rechecked after 1.5 hrs. Cvx progressed to 4 cm.   Prenatal course c/b A1GDM. Fasting sugars 80-90, 2-hr pp = 80s-110s.  Patient Active Problem List   Diagnosis Date Noted  . Normal labor 01/20/2014  . History of gestational diabetes in prior pregnancy, currently pregnant 01/20/2014  . Anemia 01/20/2014  . Gestational diabetes mellitus, class A1 01/15/2014  . Asthma 01/15/2014  . Hx of pyelonephritis 2008 01/15/2014    History of present pregnancy: Patient entered care at 16.3 weeks.   EDC of 02/02/14 was established by sure LMP and that was c/w a 9-wk sono.   Anatomy scan: 18.4 weeks, with normal findings other than low-lying anterior placenta.   Additional US evaluations: 29.4 wks - size less than dates and f/u low-lying placenta: EFW 1301 g (2lbs 14 oz); anterior placenta, low-lying placenta resolved. 36.3 wks - size less than dates: Vtx, anterior placenta, normal fluid (12.2 cm; 40th%tile), EFW 6-11 (3042 g; 48%tile). 37.1 wks - appropriate interval growth - EFW 3042 g, AFI 12.2 cm.  Significant prenatal events: Struggled w/ 2nd and 3rd trimester common discomforts of pregnancy - comfort measures offered. Seen in MAU at 25 wks for pain in ribs - RUQ u/s, amylase and lipase normal. Chronic pelvic and suprapubic pain - was to be evaluated by PT. Experienced palpitations during prenatal course; normal TSH. Seen in MAU on 01/15/14 for ? ROM - amnisure and wet prep negative.   Last evaluation: Office 01/19/14 by Carmen CroftsLC, NP; cvx 2/60/-2  OB History    Gravida Para Term Preterm AB TAB SAB Ectopic Multiple Living   7 2 2  4 2 2   2     2005 @ 4 wks, SAB, no  complications 2006 @ 4 wks, SAB, no complications 2007 @ 8 wks, TAB, no complications 07/22/06 @ 40 wks (3-hr labor), female infant, birthwt 7lbs 14 oz, [facility], delivery by Dr. Ambrose MantleHenley; received epidural 2010 @ 12 wks TAB, no complications 01/30/2009 @ 40.6 wks (4-hr labor), female infant, birthwt 8-14, [facility], delivery by Dr. Ambrose MantleHenley; received epidural   Past Medical History  Diagnosis Date  . Asthma    Past Surgical History  Procedure Laterality Date  . Bartholin gland cyst excision     Family History: family history includes Anemia in her mother; Diabetes in her father; Hypertension in her mother; Migraines in her mother; Sleep apnea in her father.Hypothyroidism and infective cystitis in her mother and DM in her PGM. Social History:  reports that she has quit smoking. Her smoking use included Cigarettes. She has never used smokeless tobacco. She reports that she drinks alcohol. She reports that she does not use illicit drugs. Pt is an African-American female who is married, of the [place] faith and a full-time Consulting civil engineerstudent.  Pt will accept blood/blood products in an emergency.  Prenatal Transfer Tool  Maternal Diabetes: Yes:  Diabetes Type:  Diet controlled - attended Diabetic class Genetic Screening: Normal  Maternal Ultrasounds/Referrals: Normal Fetal Ultrasounds or other Referrals:  None Maternal Substance Abuse:  No Significant Maternal Medications:  Meds include: Other: PNV, Iron, Phenergan prn Significant Maternal Lab Results: Lab values include: Group  B Strep negative  TDAP: 11/24/2013 Flu: 10/01/2013  ROS: Ctxs, +FM  No Known Allergies   Dilation: 4 Effacement (%): 60 Station: -2 Exam by:: Carmen Mcgee Blood pressure 111/63, pulse 96, temperature 98.1 F (36.7 C), temperature source Oral, resp. rate 20, height  (1.676 m), weight 168 lb (76.204 kg), last menstrual period 04/28/2013.  Chest clear Heart RRR without murmur Abd gravid, NT, FH CWD Pelvic: See above Ext: WNL EFW  7 1/4 lbs Bishop score: 8 FHR: Category 1 UCs: q 3-8 min  Results for orders placed or performed during the hospital encounter of 01/20/14 (from the past 24 hour(s))  CBC     Status: Abnormal   Collection Time: 01/20/14 10:15 PM  Result Value Ref Range   WBC 11.6 (H) 4.0 - 10.5 K/uL   RBC 3.42 (L) 3.87 - 5.11 MIL/uL   Hemoglobin 9.1 (L) 12.0 - 15.0 g/dL   HCT 16.1 (L) 09.6 - 04.5 %   MCV 80.7 78.0 - 100.0 fL   MCH 26.6 26.0 - 34.0 pg   MCHC 33.0 30.0 - 36.0 g/dL   RDW 40.9 81.1 - 91.4 %   Platelets 231 150 - 400 K/uL  Glucose, serum     Status: None   Collection Time: 01/20/14 10:15 PM  Result Value Ref Range   Glucose, Bld 78 70 - 99 mg/dL     Prenatal labs: ABO, Rh: A/Positive/-- (08/07 0000) Antibody: Negative (08/07 0000) Rubella:   Immune (08/14/13) RPR: Nonreactive (08/07 0000)  HBsAg: Negative (08/07 0000)  HIV: Non-reactive (08/07 0000)  GBS: Negative (12/30 0000)  Sickle cell/Hgb electrophoresis: AA Pap: Neg (08/21/13) GC:  Neg (07/02/13) Chlamydia: Neg (07/02/13) Genetic screenings: Neg AFP Glucola: Early glucola 120, 143 @ 28-wks Other: 3hr abnormal (181/191/153)  Hgb 12.4 at NOB, 11.6 on 08/14/13 and 9.8 at 28 weeks   Assessment/Plan: IUP at 38.1 wks Early labor A1GDM; well controlled GBS neg Asthmatic Favorable cvx  Plan: Admit to Birthing Suite per consult with Dr. Dion Body Routine [facility] orders; glucose with admission labs Pain med/epidural prn Consult prn Expect progress and SVD  Carmen Askew, MS 01/20/2014, 10:11 PM

## 2014-01-20 NOTE — MAU Note (Signed)
Urine in lab 

## 2014-01-20 NOTE — Anesthesia Procedure Notes (Signed)
Epidural Patient location during procedure: OB  Staffing Anesthesiologist: Kalandra Masters Performed by: anesthesiologist   Preanesthetic Checklist Completed: patient identified, site marked, surgical consent, pre-op evaluation, timeout performed, IV checked, risks and benefits discussed and monitors and equipment checked  Epidural Patient position: sitting Prep: ChloraPrep Patient monitoring: heart rate, continuous pulse ox and blood pressure Approach: right paramedian Location: L3-L4 Injection technique: LOR saline  Needle:  Needle type: Tuohy  Needle gauge: 17 G Needle length: 9 cm and 9 Needle insertion depth: 4 cm Catheter type: closed end flexible Catheter size: 20 Guage Catheter at skin depth: 9 cm Test dose: negative  Assessment Events: blood not aspirated, injection not painful, no injection resistance, negative IV test and no paresthesia  Additional Notes   Patient tolerated the insertion well without complications.   

## 2014-01-21 ENCOUNTER — Encounter (HOSPITAL_COMMUNITY)
Admission: AD | Disposition: A | Discharge status: Home / Self Care | Payer: Self-pay | Source: Ambulatory Visit | Attending: Obstetrics and Gynecology

## 2014-01-21 ENCOUNTER — Encounter (HOSPITAL_COMMUNITY): Payer: Self-pay

## 2014-01-21 ENCOUNTER — Inpatient Hospital Stay (HOSPITAL_COMMUNITY): Payer: Medicaid Other | Admitting: Anesthesiology

## 2014-01-21 HISTORY — PX: TUBAL LIGATION: SHX77

## 2014-01-21 LAB — CBC
HCT: 27.3 % — ABNORMAL LOW (ref 36.0–46.0)
Hemoglobin: 8.9 g/dL — ABNORMAL LOW (ref 12.0–15.0)
MCH: 26.4 pg (ref 26.0–34.0)
MCHC: 32.6 g/dL (ref 30.0–36.0)
MCV: 81 fL (ref 78.0–100.0)
Platelets: 224 10*3/uL (ref 150–400)
RBC: 3.37 MIL/uL — ABNORMAL LOW (ref 3.87–5.11)
RDW: 14.7 % (ref 11.5–15.5)
WBC: 12.9 10*3/uL — ABNORMAL HIGH (ref 4.0–10.5)

## 2014-01-21 LAB — MRSA PCR SCREENING: MRSA BY PCR: NEGATIVE

## 2014-01-21 LAB — TYPE AND SCREEN
ABO/RH(D): A POS
ANTIBODY SCREEN: NEGATIVE

## 2014-01-21 SURGERY — LIGATION, FALLOPIAN TUBE, POSTPARTUM
Anesthesia: Epidural | Site: Abdomen | Laterality: Bilateral

## 2014-01-21 MED ORDER — SIMETHICONE 80 MG PO CHEW: 80.0000 mg | CHEWABLE_TABLET | ORAL | Status: DC | PRN

## 2014-01-21 MED ORDER — SCOPOLAMINE 1 MG/3DAYS TD PT72
MEDICATED_PATCH | TRANSDERMAL | Status: DC | PRN
  Administered 2014-01-21: 1 via TRANSDERMAL

## 2014-01-21 MED ORDER — SODIUM BICARBONATE 8.4 % IV SOLN
INTRAVENOUS | Status: AC
  Filled 2014-01-21: qty 50

## 2014-01-21 MED ORDER — FAMOTIDINE 20 MG PO TABS
40.0000 mg | ORAL_TABLET | Freq: Once | ORAL | Status: AC
  Administered 2014-01-21: 40 mg via ORAL
  Filled 2014-01-21: qty 2

## 2014-01-21 MED ORDER — SENNOSIDES-DOCUSATE SODIUM 8.6-50 MG PO TABS
2.0000 | ORAL_TABLET | ORAL | Status: DC
  Administered 2014-01-21: 2 via ORAL
  Filled 2014-01-21: qty 2

## 2014-01-21 MED ORDER — FENTANYL CITRATE 0.05 MG/ML IJ SOLN
INTRAMUSCULAR | Status: DC | PRN
  Administered 2014-01-21 (×4): 50 ug via INTRAVENOUS

## 2014-01-21 MED ORDER — DIBUCAINE 1 % RE OINT: 1.0000 "application " | TOPICAL_OINTMENT | RECTAL | Status: DC | PRN

## 2014-01-21 MED ORDER — 0.9 % SODIUM CHLORIDE (POUR BTL) OPTIME
TOPICAL | Status: DC | PRN
  Administered 2014-01-21: 1000 mL

## 2014-01-21 MED ORDER — DIPHENHYDRAMINE HCL 25 MG PO CAPS: 25.0000 mg | ORAL_CAPSULE | Freq: Four times a day (QID) | ORAL | Status: DC | PRN

## 2014-01-21 MED ORDER — TETANUS-DIPHTH-ACELL PERTUSSIS 5-2.5-18.5 LF-MCG/0.5 IM SUSP: 0.5000 mL | Freq: Once | INTRAMUSCULAR | Status: DC

## 2014-01-21 MED ORDER — ONDANSETRON HCL 4 MG PO TABS: 4.0000 mg | ORAL_TABLET | ORAL | Status: DC | PRN

## 2014-01-21 MED ORDER — MIDAZOLAM HCL 5 MG/5ML IJ SOLN
INTRAMUSCULAR | Status: DC | PRN
  Administered 2014-01-21 (×2): 1 mg via INTRAVENOUS

## 2014-01-21 MED ORDER — LIDOCAINE-EPINEPHRINE (PF) 2 %-1:200000 IJ SOLN
INTRAMUSCULAR | Status: AC
  Filled 2014-01-21: qty 20

## 2014-01-21 MED ORDER — WITCH HAZEL-GLYCERIN EX PADS: 1.0000 "application " | MEDICATED_PAD | CUTANEOUS | Status: DC | PRN

## 2014-01-21 MED ORDER — OXYCODONE-ACETAMINOPHEN 5-325 MG PO TABS
1.0000 | ORAL_TABLET | ORAL | Status: DC | PRN
  Administered 2014-01-21 – 2014-01-22 (×2): 1 via ORAL
  Filled 2014-01-21 (×2): qty 1

## 2014-01-21 MED ORDER — METOCLOPRAMIDE HCL 10 MG PO TABS
10.0000 mg | ORAL_TABLET | Freq: Once | ORAL | Status: AC
  Administered 2014-01-21: 10 mg via ORAL
  Filled 2014-01-21: qty 1

## 2014-01-21 MED ORDER — LACTATED RINGERS IV SOLN: INTRAVENOUS | Status: DC

## 2014-01-21 MED ORDER — FERROUS SULFATE 325 (65 FE) MG PO TABS
325.0000 mg | ORAL_TABLET | Freq: Two times a day (BID) | ORAL | Status: DC
  Administered 2014-01-22: 325 mg via ORAL
  Filled 2014-01-21: qty 1

## 2014-01-21 MED ORDER — OXYTOCIN 40 UNITS IN LACTATED RINGERS INFUSION - SIMPLE MED
1.0000 m[IU]/min | INTRAVENOUS | Status: DC
  Administered 2014-01-21: 1 m[IU]/min via INTRAVENOUS

## 2014-01-21 MED ORDER — ONDANSETRON HCL 4 MG/2ML IJ SOLN
INTRAMUSCULAR | Status: AC
  Filled 2014-01-21: qty 2

## 2014-01-21 MED ORDER — DEXAMETHASONE SODIUM PHOSPHATE 4 MG/ML IJ SOLN
INTRAMUSCULAR | Status: AC
Start: 2014-01-21 — End: 2014-01-21
  Filled 2014-01-21: qty 1

## 2014-01-21 MED ORDER — ONDANSETRON HCL 4 MG/2ML IJ SOLN: 4.0000 mg | INTRAMUSCULAR | Status: DC | PRN

## 2014-01-21 MED ORDER — SCOPOLAMINE 1 MG/3DAYS TD PT72
MEDICATED_PATCH | TRANSDERMAL | Status: AC
  Filled 2014-01-21: qty 1

## 2014-01-21 MED ORDER — LACTATED RINGERS IV SOLN
INTRAVENOUS | Status: DC
  Administered 2014-01-21: 15:00:00 via INTRAVENOUS

## 2014-01-21 MED ORDER — FENTANYL CITRATE 0.05 MG/ML IJ SOLN: 25.0000 ug | INTRAMUSCULAR | Status: DC | PRN

## 2014-01-21 MED ORDER — MEPERIDINE HCL 25 MG/ML IJ SOLN: 6.2500 mg | INTRAMUSCULAR | Status: DC | PRN

## 2014-01-21 MED ORDER — ZOLPIDEM TARTRATE 5 MG PO TABS: 5.0000 mg | ORAL_TABLET | Freq: Every evening | ORAL | Status: DC | PRN

## 2014-01-21 MED ORDER — DEXAMETHASONE SODIUM PHOSPHATE 4 MG/ML IJ SOLN
INTRAMUSCULAR | Status: DC | PRN
  Administered 2014-01-21: 4 mg via INTRAVENOUS

## 2014-01-21 MED ORDER — LACTATED RINGERS IV SOLN
INTRAVENOUS | Status: DC
  Administered 2014-01-21: 11:00:00 via INTRAVENOUS

## 2014-01-21 MED ORDER — MIDAZOLAM HCL 2 MG/2ML IJ SOLN
INTRAMUSCULAR | Status: AC
  Filled 2014-01-21: qty 2

## 2014-01-21 MED ORDER — FENTANYL CITRATE 0.05 MG/ML IJ SOLN
INTRAMUSCULAR | Status: AC
  Filled 2014-01-21: qty 5

## 2014-01-21 MED ORDER — BENZOCAINE-MENTHOL 20-0.5 % EX AERO
1.0000 "application " | INHALATION_SPRAY | CUTANEOUS | Status: DC | PRN
  Administered 2014-01-21: 1 via TOPICAL
  Filled 2014-01-21: qty 56

## 2014-01-21 MED ORDER — METOCLOPRAMIDE HCL 5 MG/ML IJ SOLN: 10.0000 mg | Freq: Once | INTRAMUSCULAR | Status: AC | PRN

## 2014-01-21 MED ORDER — LANOLIN HYDROUS EX OINT: TOPICAL_OINTMENT | CUTANEOUS | Status: DC | PRN

## 2014-01-21 MED ORDER — KETOROLAC TROMETHAMINE 30 MG/ML IJ SOLN
INTRAMUSCULAR | Status: DC | PRN
  Administered 2014-01-21: 30 mg via INTRAVENOUS

## 2014-01-21 MED ORDER — LIDOCAINE-EPINEPHRINE 1 %-1:100000 IJ SOLN
INTRAMUSCULAR | Status: DC | PRN
  Administered 2014-01-21: 7 mL

## 2014-01-21 MED ORDER — OXYCODONE-ACETAMINOPHEN 5-325 MG PO TABS: 2.0000 | ORAL_TABLET | ORAL | Status: DC | PRN

## 2014-01-21 MED ORDER — KETOROLAC TROMETHAMINE 30 MG/ML IJ SOLN
INTRAMUSCULAR | Status: AC
  Filled 2014-01-21: qty 1

## 2014-01-21 MED ORDER — TERBUTALINE SULFATE 1 MG/ML IJ SOLN
0.2500 mg | Freq: Once | INTRAMUSCULAR | Status: DC | PRN
  Filled 2014-01-21: qty 1

## 2014-01-21 MED ORDER — SODIUM BICARBONATE 8.4 % IV SOLN
INTRAVENOUS | Status: DC | PRN
  Administered 2014-01-21: 2 mL via EPIDURAL
  Administered 2014-01-21 (×3): 5 mL via EPIDURAL
  Administered 2014-01-21: 3 mL via EPIDURAL
  Administered 2014-01-21: 5 mL via EPIDURAL

## 2014-01-21 MED ORDER — IBUPROFEN 600 MG PO TABS
600.0000 mg | ORAL_TABLET | Freq: Four times a day (QID) | ORAL | Status: DC
  Administered 2014-01-21 – 2014-01-22 (×4): 600 mg via ORAL
  Filled 2014-01-21 (×4): qty 1

## 2014-01-21 MED ORDER — LIDOCAINE-EPINEPHRINE 1 %-1:100000 IJ SOLN
INTRAMUSCULAR | Status: AC
  Filled 2014-01-21: qty 1

## 2014-01-21 MED ORDER — PRENATAL MULTIVITAMIN CH
1.0000 | ORAL_TABLET | Freq: Every day | ORAL | Status: DC
Start: 2014-01-21 — End: 2014-01-22

## 2014-01-21 MED ORDER — ONDANSETRON HCL 4 MG/2ML IJ SOLN
INTRAMUSCULAR | Status: DC | PRN
  Administered 2014-01-21: 4 mg via INTRAVENOUS

## 2014-01-21 SURGICAL SUPPLY — 24 items
CHLORAPREP W/TINT 26ML (MISCELLANEOUS) ×3 IMPLANT
CLOTH BEACON ORANGE TIMEOUT ST (SAFETY) ×3 IMPLANT
CONTAINER PREFILL 10% NBF 15ML (MISCELLANEOUS) ×6 IMPLANT
DRSG OPSITE POSTOP 3X4 (GAUZE/BANDAGES/DRESSINGS) ×3 IMPLANT
ELECT REM PT RETURN 9FT ADLT (ELECTROSURGICAL) ×3
ELECTRODE REM PT RTRN 9FT ADLT (ELECTROSURGICAL) ×1 IMPLANT
GLOVE BIOGEL PI IND STRL 7.0 (GLOVE) ×1 IMPLANT
GLOVE BIOGEL PI INDICATOR 7.0 (GLOVE) ×2
GLOVE ECLIPSE 6.5 STRL STRAW (GLOVE) ×3 IMPLANT
GOWN STRL REUS W/TWL LRG LVL3 (GOWN DISPOSABLE) ×6 IMPLANT
LIQUID BAND (GAUZE/BANDAGES/DRESSINGS) ×3 IMPLANT
NEEDLE HYPO 25X1 1.5 SAFETY (NEEDLE) ×3 IMPLANT
NS IRRIG 1000ML POUR BTL (IV SOLUTION) ×3 IMPLANT
PACK ABDOMINAL MINOR (CUSTOM PROCEDURE TRAY) ×3 IMPLANT
PENCIL BUTTON HOLSTER BLD 10FT (ELECTRODE) ×3 IMPLANT
SLEEVE SCD COMPRESS KNEE MED (MISCELLANEOUS) ×3 IMPLANT
SPONGE LAP 4X18 X RAY DECT (DISPOSABLE) ×3 IMPLANT
SUT MON AB 4-0 PS1 27 (SUTURE) ×3 IMPLANT
SUT PLAIN 1 NONE 54 (SUTURE) ×3 IMPLANT
SUT VIC AB 0 CT1 27 (SUTURE) ×4
SUT VIC AB 0 CT1 27XBRD ANBCTR (SUTURE) ×2 IMPLANT
SYR CONTROL 10ML LL (SYRINGE) ×3 IMPLANT
TOWEL OR 17X24 6PK STRL BLUE (TOWEL DISPOSABLE) ×6 IMPLANT
TRAY FOLEY CATH 14FR (SET/KITS/TRAYS/PACK) ×3 IMPLANT

## 2014-01-21 NOTE — Progress Notes (Signed)
Post Partum Day 0 Subjective: has not eaten anything since delivery.  desires postpartum tubal ligation.   Objective: Blood pressure 111/69, pulse 78, temperature 97.8 F (36.6 C), temperature source Oral, resp. rate 20, height 5\' 6"  (1.676 m), weight 168 lb (76.204 kg), last menstrual period 04/28/2013, SpO2 98 %, unknown if currently breastfeeding.  Physical Exam:  General: alert and cooperative Lochia: appropriate Uterine Fundus: firm Incision: None DVT Evaluation: No evidence of DVT seen on physical exam.   Recent Labs  01/20/14 2215 01/21/14 0725  HGB 9.1* 8.9*  HCT 27.6* 27.3*    Assessment/Plan: Discussed risks, benefits and alternatives of postpartum tubal ligation including risks of bleeding, infection, damage to organs.  Discussed risks of tubal regret, but she is 100% sure no more children.  All questions answered.  She declines bilateral salpingectomy but will accept Pomeroy methods.    LOS: 1 day   [facility] HospitalKULWA,Moksh Loomer [facility] ChildrenWAKURU 01/21/2014, 3:22 PM

## 2014-01-21 NOTE — Anesthesia Postprocedure Evaluation (Signed)
  Anesthesia Post-op Note  Patient: Carmen Mcgee  Procedure(s) Performed: * No procedures listed *  Patient Location: Mother/Baby  Anesthesia Type:Epidural  Level of Consciousness: awake, alert , oriented and patient cooperative  Airway and Oxygen Therapy: Patient Spontanous Breathing  Post-op Pain: mild  Post-op Assessment: Post-op Vital signs reviewed, Patient's Cardiovascular Status Stable, Respiratory Function Stable, Patent Airway, No headache, No backache, No residual numbness and No residual motor weakness  Post-op Vital Signs: Reviewed and stable  Last Vitals:  Filed Vitals:   01/21/14 0556  BP: 119/68  Pulse: 95  Temp: 37.1 C  Resp: 18    Complications: No apparent anesthesia complications

## 2014-01-21 NOTE — Anesthesia Postprocedure Evaluation (Signed)
  Anesthesia Post-op Note  Patient: Carmen Mcgee  Procedure(s) Performed: Procedure(s): POST PARTUM TUBAL LIGATION (Bilateral)  Patient Location: PACU  Anesthesia Type:Epidural  Level of Consciousness: awake, alert  and oriented  Airway and Oxygen Therapy: Patient Spontanous Breathing  Post-op Pain: none  Post-op Assessment: Post-op Vital signs reviewed, Patient's Cardiovascular Status Stable, Respiratory Function Stable, Patent Airway, No signs of Nausea or vomiting, Pain level controlled, No headache, No backache and No residual numbness  Post-op Vital Signs: Reviewed and stable  Last Vitals:  Filed Vitals:   01/21/14 1715  BP: 116/70  Pulse: 88  Temp: 36.7 C  Resp: 22    Complications: No apparent anesthesia complications

## 2014-01-21 NOTE — Progress Notes (Signed)
UR chart review completed.  

## 2014-01-21 NOTE — Procedures (Signed)
Carmen Mcgee, Bahar K. DOB [birth year]  MRN: [id number]  PREOP DIAGNOSIS: Multiparous patient desiring permanent sterilization.  POSTOP DIAGNOSIS: Same as above  PROCEDURE: Post partum bilateral tubal ligation via modified Pomeroy Method.  SURGEON: Dr. Hoover BrownsEma Mayzie Caughlin  ASSISTANT: Scrub tech: Stanton Kidneyebra  ANESTHESIA: Bolused epidural.  COMPLICATIONS: None  EBL: 5 mL  IV FLUID: 500 mL LR  URINE OUTPUT: 300 mL  FINDINGS: Normal uterus. Normal fallopian tubes bilaterally.   PROCEDURE:   Informed consent was obtained from the patient to undergo the procedure after discussing the risks benefits and alternatives of the procedure including risks of tubal failure, tubal regret, bleeding, infection and damage to organs.  She also understood that there are other temporary methods of birth control, and also female sterilization and she did not desire these other options.  She was taken to the operating room where anesthesia was administered without difficulty.  She was prepped abdominally in the usual sterile fashion. Foley catheter was placed in the bladder.    1% lidocaine with epinephrine was instilled in the infraumbilical area. The skin was incised with a scalpel and and entry into the abdomen was made through the subcutaneous layer, fascia and peritoneal layers using Allis clamps, hemostats, retractors and metzenbaum scissors.  Once in the abdomen, the fascia was tied with 0 Vicryl in a  pursestring suture. The patient was tilted to her left side and the right fallopian tube was identified and followed up to the fimbria end through the incision. A portion of the  Fallopian tube about 3 cm distal to the uterus cornua was grasped with a Babcock clamp and free tie of 1-0 plain used to isolate this 2 cm portion of tube. Another tie was placed below the first one. The mesosalpinx was transected and then the portion of the isolated tube was transected using Metzenbaums. The edges of the cut tubes were further trimmed.  The Bovie was used over the tubal edges to enhance hemostasis. The tube was then returned into the abdominal cavity. The patient was then turned tilted to her right side and this allowed identification of the left fallopian tube. This was similarly followed up to the fimbria and and a portion of the tube isolated and transected as done on the right side. Excellent hemostasis was not added. The fascia was then closed using 0 Vicryl in a running stitch and the pursestring suture. The skin was closed using 4-0 Monocryl subcuticular stitch. Dermabond and Tegaderm dressing were placed. The lap instrument and needle counts were correct. She was taken to the operating room in a stable condition.  SPECIMEN: Portions of left and right fallopian tubes

## 2014-01-21 NOTE — Transfer of Care (Signed)
Immediate Anesthesia Transfer of Care Note  Patient: Carmen Mcgee  Procedure(s) Performed: Procedure(s): POST PARTUM TUBAL LIGATION (Bilateral)  Patient Location: PACU  Anesthesia Type:Epidural  Level of Consciousness: awake, alert  and oriented  Airway & Oxygen Therapy: Patient Spontanous Breathing  Post-op Assessment: Report given to PACU RN and Post -op Vital signs reviewed and stable  Post vital signs: Reviewed and stable  Complications: No apparent anesthesia complications

## 2014-01-21 NOTE — Progress Notes (Signed)
  Subjective: Resting, yet easily aroused. Comfortable w/ epidural. Family at bedside.  Objective: BP 113/69 mmHg  Pulse 92  Temp(Src) 98.1 F (36.7 C) (Oral)  Resp 20  Ht 5\' 6"  (1.676 m)  Wt 168 lb (76.204 kg)  BMI 27.13 kg/m2  SpO2 99%  LMP 04/28/2013     FHT: BL 135 w/ moderate variability, +accels, earlys, no lates or variables UC:   irregular, every 3-6 minutes SVE: 5-6/80/-2 AROM - scant amount of blood tinged fluid IUPC placed w/o difficulty  Assessment:  IUP at 38.2 wks Active labor despite irregular ctx pattern Cat 1 FHRT   Plan: Reviewed spacing out of ctxs with family and the need for Pitocin augmentation. R&B of Pitocin augmentation and AROM discussed with pt and family, including risk of C/S and/or need for further intervention. Patient and family seem to understand these risks and are agreeable with proceeding.  Consult prn Expect progress and SVD  [name], Rhys Lichty CNM 01/21/2014, 1:33 AM

## 2014-01-21 NOTE — Progress Notes (Signed)
  Subjective: Called to room by RN due to pt feeling pressure. Family at bedside.  Objective: BP 115/65 mmHg  Pulse 89  Temp(Src) 98 F (36.7 C) (Axillary)  Resp 20  Ht 5\' 6"  (1.676 m)  Wt 168 lb (76.204 kg)  BMI 27.13 kg/m2  SpO2 99%  LMP 04/28/2013     FHT: BL 135 w/ moderate variability, +accels, intermittent mild variables, earlys UC: regular, every 3 minutes SVE:   Dilation: 10 Effacement (%): 100 Station: +1 Exam by:: Chanda BusingKatherine G Jones RN Pitocin at 2 mius/min  Assessment:  IUP at 38.2 wks 2nd stage labor    Plan: Expect SVD briefly Consult prn  [name], Kaevion Sinclair CNM 01/21/2014, 2:59 AM

## 2014-01-21 NOTE — Anesthesia Preprocedure Evaluation (Signed)
Anesthesia Evaluation  Patient identified by MRN, date of birth, ID band Patient awake    Reviewed: Allergy & Precautions, H&P , NPO status , Patient's Chart, lab work & pertinent test results  History of Anesthesia Complications Negative for: history of anesthetic complications  Airway Mallampati: II  TM Distance: >3 FB Neck ROM: full    Dental no notable dental hx. (+) Teeth Intact   Pulmonary asthma , former smoker,  breath sounds clear to auscultation  Pulmonary exam normal       Cardiovascular negative cardio ROS  Rhythm:regular Rate:Normal     Neuro/Psych negative neurological ROS  negative psych ROS   GI/Hepatic negative GI ROS, Neg liver ROS, GERD-  Controlled and Medicated,  Endo/Other  diabetes, Well Controlled, Gestational  Renal/GU negative Renal ROS  negative genitourinary   Musculoskeletal   Abdominal Normal abdominal exam  (+)   Peds  Hematology negative hematology ROS (+) anemia ,   Anesthesia Other Findings   Reproductive/Obstetrics negative OB ROS Desires sterilization                             Anesthesia Physical  Anesthesia Plan  ASA: II  Anesthesia Plan: Epidural   Post-op Pain Management:    Induction:   Airway Management Planned: Natural Airway  Additional Equipment:   Intra-op Plan:   Post-operative Plan:   Informed Consent: I have reviewed the patients History and Physical, chart, labs and discussed the procedure including the risks, benefits and alternatives for the proposed anesthesia with the patient or authorized representative who has indicated his/her understanding and acceptance.     Plan Discussed with: Anesthesiologist, CRNA and Surgeon  Anesthesia Plan Comments:         Anesthesia Quick Evaluation

## 2014-01-22 ENCOUNTER — Encounter (HOSPITAL_COMMUNITY): Payer: Self-pay | Admitting: Obstetrics & Gynecology

## 2014-01-22 LAB — RPR: RPR Ser Ql: NONREACTIVE

## 2014-01-22 MED ORDER — OXYCODONE-ACETAMINOPHEN 5-325 MG PO TABS: 1.0000 | ORAL_TABLET | ORAL | Status: AC | PRN

## 2014-01-22 MED ORDER — IBUPROFEN 600 MG PO TABS: 600.0000 mg | ORAL_TABLET | Freq: Four times a day (QID) | ORAL | Status: AC | PRN

## 2014-01-22 NOTE — Addendum Note (Signed)
Addendum  created 01/22/14 0914 by Algis GreenhouseLinda A Aleli Navedo, CRNA   Modules edited: Notes Section   Notes Section:  File: [id number]

## 2014-01-22 NOTE — Lactation Note (Signed)
This note was copied from the chart of Carmen Rolanda Jayshley Balling. Lactation Consultation Note Experienced BF mom who BF her 2nd child who is 5 for 1 yr. Didn't BF her 1st child. States having no difficulty latching, denies pain, states baby gets a deep latch. Mom states her breast are getting heavy and she had to put her bra on.  Mom encouraged to feed baby 8-12 times/24 hours and with feeding cues. Mom encouraged to waken baby for feeds. Educated about newborn behavior. Mom encouraged to do skin-to-skin.Referred to Baby and Me Book in Breastfeeding section Pg. 22-23 for position options and Proper latch demonstration.[facility] brochure given w/resources, support groups and LC services.Encouraged comfort during BF so colostrum flows better and mom will enjoy the feeding longer. Taking deep breaths and breast massage during BF.  Patient Name: Carmen Mcgee ZOXWR'UToday's Date: 01/22/2014 Reason for consult: Initial assessment   Maternal Data Has patient been taught Hand Expression?: Yes Does the patient have breastfeeding experience prior to this delivery?: Yes  Feeding Feeding Type: Breast Fed Length of feed: 60 min  LATCH Score/Interventions                      Lactation Tools Discussed/Used     Consult Status Consult Status: Follow-up Date: 01/23/14 Follow-up type: In-patient    Yaremi Stahlman, Diamond NickelLAURA G 01/22/2014, 8:21 AM

## 2014-01-22 NOTE — Progress Notes (Signed)
Subjective: Postpartum Day 1: Vaginal delivery, no laceration Post-op Day 1 from BTL Patient up ad lib, reports no syncope or dizziness. Feeding:  Breast Contraceptive plan:  BTL  Objective: Vital signs in last 24 hours: Temp:  [97.8 F (36.6 C)-99.2 F (37.3 C)] 99 F (37.2 C) (01/15 0145) Pulse Rate:  [64-98] 64 (01/15 0145) Resp:  [14-25] 16 (01/15 0145) BP: (110-124)/(55-77) 110/55 mmHg (01/15 0145) SpO2:  [97 %-100 %] 97 % (01/15 0145)  Physical Exam:  General: alert Lochia: appropriate Uterine Fundus: firm Perineum: BTL incisional dressing CDI DVT Evaluation: No evidence of DVT seen on physical exam. Negative Homan's sign.    Recent Labs  01/20/14 2215 01/21/14 0725  HGB 9.1* 8.9*  HCT 27.6* 27.3*    Assessment/Plan: Status post vaginal delivery day 1, post-op day 1 from BTL Mild anemia, without hemodynamic instability. Patient may want d/c later today if baby able to go.  I will see patient later today for reassessment of plan. Fe BID   Ko Bardon, VICKICNM 01/22/2014, 8:57 AM

## 2014-01-22 NOTE — Anesthesia Postprocedure Evaluation (Signed)
Anesthesia Post Note  Patient: Carmen Mcgee  Procedure(s) Performed: * No procedures listed *  Anesthesia type: Epidural  Patient location: Mother/Baby  Post pain: Pain level controlled  Post assessment: Post-op Vital signs reviewed  Last Vitals:  Filed Vitals:   01/22/14 0145  BP: 110/55  Pulse: 64  Temp: 37.2 C  Resp: 16    Post vital signs: Reviewed  Level of consciousness:alert  Complications: No apparent anesthesia complications

## 2014-01-22 NOTE — Discharge Summary (Signed)
  Vaginal Delivery Discharge Summary  Carmen Mcgee  DOB:    10/19/1985 MRN:    [id number] CSN:    [id number]  Date of admission:                  01/20/14  Date of discharge:                   01/22/14  Procedures this admission:   SVB  Date of Delivery: 01/21/14  Newborn Data:  Live born female  Birth Weight: 7 lb 11.1 oz (3490 g) APGAR: 8, 9  Home with mother. Name: Carmen Mcgee   History of Present Illness:  [name] is a 29 y.o. female, [phone], who presents at 1757w2d weeks gestation. [facility] She was admitted onset of labor. Her pregnancy has been complicated by:  Patient Active Problem List   Diagnosis Date Noted  . NSVD (normal spontaneous vaginal delivery) 01/21/2014  . Anemia 01/20/2014  . Gestational diabetes mellitus, class A1 01/15/2014  . Asthma 01/15/2014  . Hx of pyelonephritis 2008 01/15/2014     Hospital Course:  Admitted 01/20/14 in early labor.  Negative GBS. Progressed with pitocin augmentation . Utilized epidural for pain management.  Delivery was performed by Sherre ScarletKimberly Williams, CNM, without complication. Patient and baby tolerated the procedure without difficulty, with no laceration noted. Infant status was stable and remained in room with mother.  Mother and infant then had an uncomplicated postpartum course, with breast feeding going well. Mom's physical exam was WNL, and she was discharged home in stable condition. BTL was performed on 01/21/14 by Dr. Sallye OberKulwa.  Hgb on day 1 was 8.9, down from 9.1 pre-delivery. She received adequate benefit from po pain medications and was d/c'd home on Ibuprophen and Percocet.  She reported intolerance to oral Fe, so she was encouraged to increase iron-rich foods in her diet and to maintain good oral hydration.  She desired d/c on day 1 and was sent home in good  condition.   Feeding:  breast  Contraception:  bilateral tubal ligation  Discharge hemoglobin:  HEMOGLOBIN  Date Value Ref Range Status  01/21/2014 8.9* 12.0 - 15.0 g/dL Final   HCT  Date Value Ref Range Status  01/21/2014 27.3* 36.0 - 46.0 % Final    Discharge Physical Exam:   General: alert Lochia: appropriate Uterine Fundus: firm Incision: healing well DVT Evaluation: No evidence of DVT seen on physical exam. Negative Homan's sign.  Intrapartum Procedures: spontaneous vaginal delivery Postpartum Procedures: P.P. tubal ligation Complications-Operative and Postpartum: Anemia without hemodynamic instability.  Discharge Diagnoses: Term Pregnancy-delivered  Discharge Information:  Activity:           pelvic rest Diet:                routine, with increase in iron-rich foods and fluid. Medications: Ibuprofen and Percocet--patient intolerant to Fe. Condition:      stable Instructions:     Discharge to: home     Nigel BridgemanLATHAM, Saya Mccoll CNM 01/22/2014 11:35 AM

## 2014-01-22 NOTE — Discharge Instructions (Signed)
Iron Deficiency Anemia Anemia is a condition in which there are less red blood cells or hemoglobin in the blood than normal. Hemoglobin is the part of red blood cells that carries oxygen. Iron deficiency anemia is anemia caused by too little iron. It is the most common type of anemia. It may leave you tired and short of breath. CAUSES   Lack of iron in the diet.  Poor absorption of iron, as seen with intestinal disorders.  Intestinal bleeding.  Heavy periods. SIGNS AND SYMPTOMS  Mild anemia may not be noticeable. Symptoms may include:  Fatigue.  Headache.  Pale skin.  Weakness.  Tiredness.  Shortness of breath.  Dizziness.  Cold hands and feet.  Fast or irregular heartbeat. DIAGNOSIS  Diagnosis requires a thorough evaluation and physical exam by your health care provider. Blood tests are generally used to confirm iron deficiency anemia. Additional tests may be done to find the underlying cause of your anemia. These may include:  Testing for blood in the stool (fecal occult blood test).  A procedure to see inside the colon and rectum (colonoscopy).  A procedure to see inside the esophagus and stomach (endoscopy). TREATMENT  Iron deficiency anemia is treated by correcting the cause of the deficiency. Treatment may involve:  Adding iron-rich foods to your diet.  Taking iron supplements. Pregnant or breastfeeding women need to take extra iron because their normal diet usually does not provide the required amount.  Taking vitamins. Vitamin C improves the absorption of iron. Your health care provider may recommend that you take your iron tablets with a glass of orange juice or vitamin C supplement.  Medicines to make heavy menstrual flow lighter.  Surgery. HOME CARE INSTRUCTIONS   Take iron as directed by your health care provider.  If you cannot tolerate taking iron supplements by mouth, talk to your health care provider about taking them through a vein  (intravenously) or an injection into a muscle.  For the best iron absorption, iron supplements should be taken on an empty stomach. If you cannot tolerate them on an empty stomach, you may need to take them with food.  Do not drink milk or take antacids at the same time as your iron supplements. Milk and antacids may interfere with the absorption of iron.  Iron supplements can cause constipation. Make sure to include fiber in your diet to prevent constipation. A stool softener may also be recommended.  Take vitamins as directed by your health care provider.  Eat a diet rich in iron. Foods high in iron include liver, lean beef, whole-grain bread, eggs, dried fruit, and dark green leafy vegetables. SEEK IMMEDIATE MEDICAL CARE IF:   You faint. If this happens, do not drive. Call your local emergency services (911 in [place] if no other help is available.  You have chest pain.  You feel nauseous or vomit.  You have severe or increased shortness of breath with activity.  You feel weak.  You have a rapid heartbeat.  You have unexplained sweating.  You become light-headed when getting up from a chair or bed. MAKE SURE YOU:   Understand these instructions.  Will watch your condition.  Will get help right away if you are not doing well or get worse. Document Released: 12/23/1999 Document Revised: 12/30/2012 Document Reviewed: 09/01/2012 [facility] Patient Information 2015 [place], [place]. This information is not intended to replace advice given to you by your health care provider. Make sure you discuss any questions you have with your health care provider.  Iron-Rich Diet An iron-rich diet contains foods that are good sources of iron. Iron is an important mineral that helps your body produce hemoglobin. Hemoglobin is a protein in red blood cells that carries oxygen to the body's tissues. Sometimes, the iron level in your blood can be low. This may be caused by:  A lack of iron in your  diet.  Blood loss.  Times of growth, such as during pregnancy or during a child's growth and development. Low levels of iron can cause a decrease in the number of red blood cells. This can result in iron deficiency anemia. Iron deficiency anemia symptoms include:  Tiredness.  Weakness.  Irritability.  Increased chance of infection. Here are some recommendations for daily iron intake:  Males older than 29 years of age need 8 mg of iron per day.  Women ages 47 to 66 need 18 mg of iron per day.  Pregnant women need 27 mg of iron per day, and women who are over 82 years of age and breastfeeding need 9 mg of iron per day.  Women over the age of 78 need 8 mg of iron per day. SOURCES OF IRON There are 2 types of iron that are found in food: heme iron and nonheme iron. Heme iron is absorbed by the body better than nonheme iron. Heme iron is found in meat, poultry, and fish. Nonheme iron is found in grains, beans, and vegetables. Heme Iron Sources Food / Iron (mg)  Chicken liver, 3 oz (85 g)/ 10 mg  Beef liver, 3 oz (85 g)/ 5.5 mg  Oysters, 3 oz (85 g)/ 8 mg  Beef, 3 oz (85 g)/ 2 to 3 mg  Shrimp, 3 oz (85 g)/ 2.8 mg  [place], 3 oz (85 g)/ 2 mg  Chicken, 3 oz (85 g) / 1 mg  Fish (tuna, halibut), 3 oz (85 g)/ 1 mg  Pork, 3 oz (85 g)/ 0.9 mg Nonheme Iron Sources Food / Iron (mg)  Ready-to-eat breakfast cereal, iron-fortified / 3.9 to 7 mg  Tofu,  cup / 3.4 mg  Kidney beans,  cup / 2.6 mg  Baked potato with skin / 2.7 mg  Asparagus,  cup / 2.2 mg  Avocado / 2 mg  Dried peaches,  cup / 1.6 mg  Raisins,  cup / 1.5 mg  Soy milk, 1 cup / 1.5 mg  Whole-wheat bread, 1 slice / 1.2 mg  Spinach, 1 cup / 0.8 mg  Broccoli,  cup / 0.6 mg IRON ABSORPTION Certain foods can decrease the body's absorption of iron. Try to avoid these foods and beverages while eating meals with iron-containing foods:  Coffee.  Tea.  Fiber.  Soy. Foods containing vitamin C can  help increase the amount of iron your body absorbs from iron sources, especially from nonheme sources. Eat foods with vitamin C along with iron-containing foods to increase your iron absorption. Foods that are high in vitamin C include many fruits and vegetables. Some good sources are:  Fresh orange juice.  Oranges.  Strawberries.  Mangoes.  Grapefruit.  Red bell peppers.  Green bell peppers.  Broccoli.  Potatoes with skin.  Tomato juice. Document Released: 08/08/2004 Document Revised: 03/19/2011 Document Reviewed: 06/15/2010 [facility] Patient Information 2015 [place], [place]. This information is not intended to replace advice given to you by your health care provider. Make sure you discuss any questions you have with your health care provider.   Postpartum Care After Vaginal Delivery After you deliver your newborn (postpartum period), the  usual stay in the hospital is 24-72 hours. If there were problems with your labor or delivery, or if you have other medical problems, you might be in the hospital longer.  While you are in the hospital, you will receive help and instructions on how to care for yourself and your newborn during the postpartum period.  While you are in the hospital:  Be sure to tell your nurses if you have pain or discomfort, as well as where you feel the pain and what makes the pain worse.  If you had an incision made near your vagina (episiotomy) or if you had some tearing during delivery, the nurses may put ice packs on your episiotomy or tear. The ice packs may help to reduce the pain and swelling.  If you are breastfeeding, you may feel uncomfortable contractions of your uterus for a couple of weeks. This is normal. The contractions help your uterus get back to normal size.  It is normal to have some bleeding after delivery.  For the first 1-3 days after delivery, the flow is red and the amount may be similar to a period.  It is common for the flow to start  and stop.  In the first few days, you may pass some small clots. Let your nurses know if you begin to pass large clots or your flow increases.  Do not  flush blood clots down the toilet before having the nurse look at them.  During the next 3-10 days after delivery, your flow should become more watery and pink or brown-tinged in color.  Ten to fourteen days after delivery, your flow should be a small amount of yellowish-white discharge.  The amount of your flow will decrease over the first few weeks after delivery. Your flow may stop in 6-8 weeks. Most women have had their flow stop by 12 weeks after delivery.  You should change your sanitary pads frequently.  Wash your hands thoroughly with soap and water for at least 20 seconds after changing pads, using the toilet, or before holding or feeding your newborn.  You should feel like you need to empty your bladder within the first 6-8 hours after delivery.  In case you become weak, lightheaded, or faint, call your nurse before you get out of bed for the first time and before you take a shower for the first time.  Within the first few days after delivery, your breasts may begin to feel tender and full. This is called engorgement. Breast tenderness usually goes away within 48-72 hours after engorgement occurs. You may also notice milk leaking from your breasts. If you are not breastfeeding, do not stimulate your breasts. Breast stimulation can make your breasts produce more milk.  Spending as much time as possible with your newborn is very important. During this time, you and your newborn can feel close and get to know each other. Having your newborn stay in your room (rooming in) will help to strengthen the bond with your newborn. It will give you time to get to know your newborn and become comfortable caring for your newborn.  Your hormones change after delivery. Sometimes the hormone changes can temporarily cause you to feel sad or tearful.  These feelings should not last more than a few days. If these feelings last longer than that, you should talk to your caregiver.  If desired, talk to your caregiver about methods of family planning or contraception.  Talk to your caregiver about immunizations. Your caregiver may want you  to have the following immunizations before leaving the hospital:  Tetanus, diphtheria, and pertussis (Tdap) or tetanus and diphtheria (Td) immunization. It is very important that you and your family (including grandparents) or others caring for your newborn are up-to-date with the Tdap or Td immunizations. The Tdap or Td immunization can help protect your newborn from getting ill.  Rubella immunization.  Varicella (chickenpox) immunization.  Influenza immunization. You should receive this annual immunization if you did not receive the immunization during your pregnancy. Document Released: 10/22/2006 Document Revised: 09/19/2011 Document Reviewed: 08/22/2011 [facility] Patient Information 2015 [place], [place]. This information is not intended to replace advice given to you by your health care provider. Make sure you discuss any questions you have with your health care provider.

## 2015-06-16 IMAGING — US US OB TRANSVAGINAL
1 series · 14 of 28 positions shown · non-contrast
Comparison: None.

CLINICAL DATA: Vomiting, right lower quadrant pain

EXAM:
OBSTETRIC <14 WK US AND TRANSVAGINAL OB US
TECHNIQUE: Both transabdominal and transvaginal ultrasound examinations were
performed for complete evaluation of the gestation as well as the
maternal uterus, adnexal regions, and pelvic cul-de-sac.
Transvaginal technique was performed to assess early pregnancy.

[Series 1: us ob comp less 14 wks · 74 acquisitions, 14 frames shown]
[im 3/74]
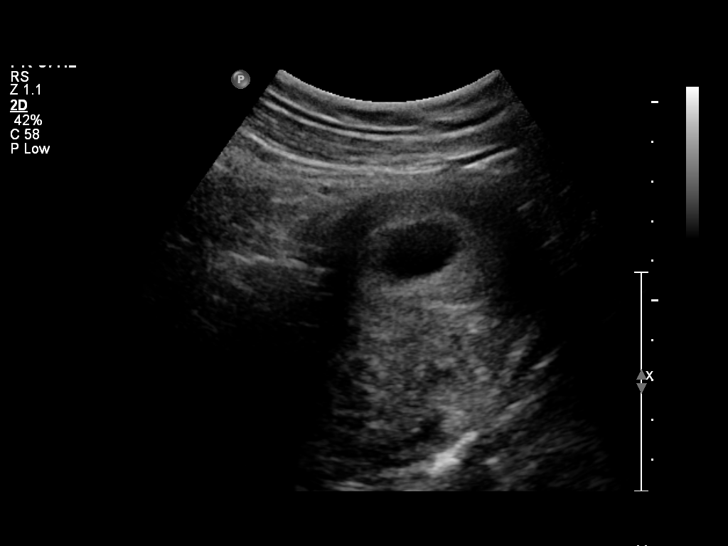
[im 9/74]
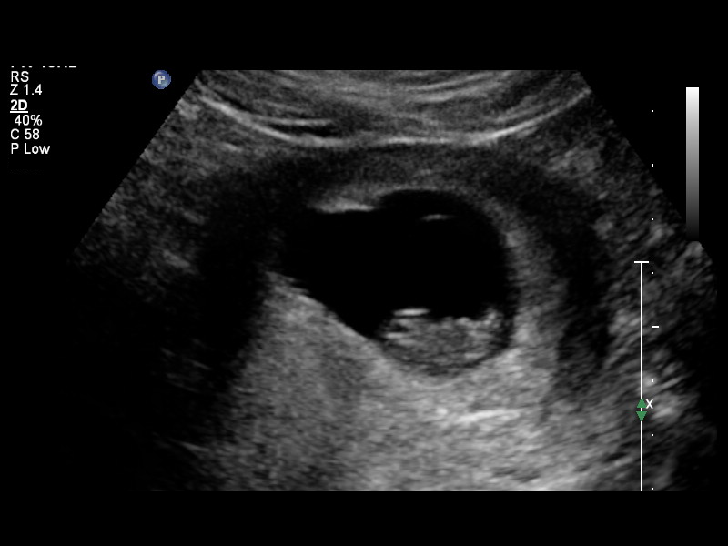
[im 14/74]
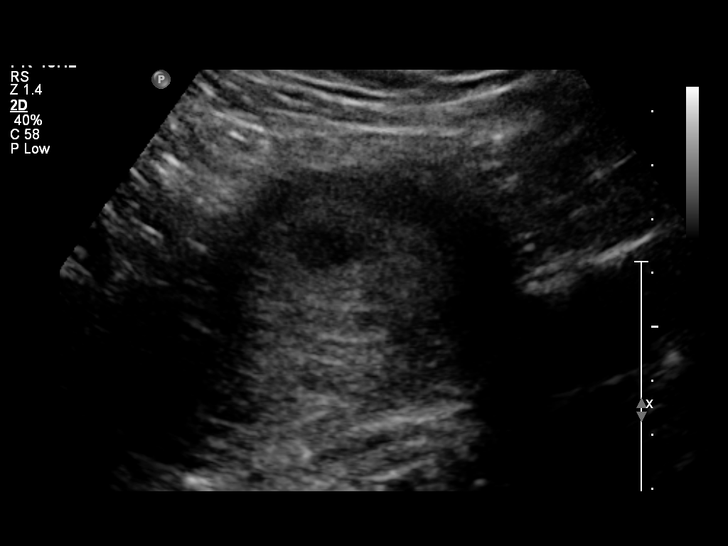
[im 19/74]
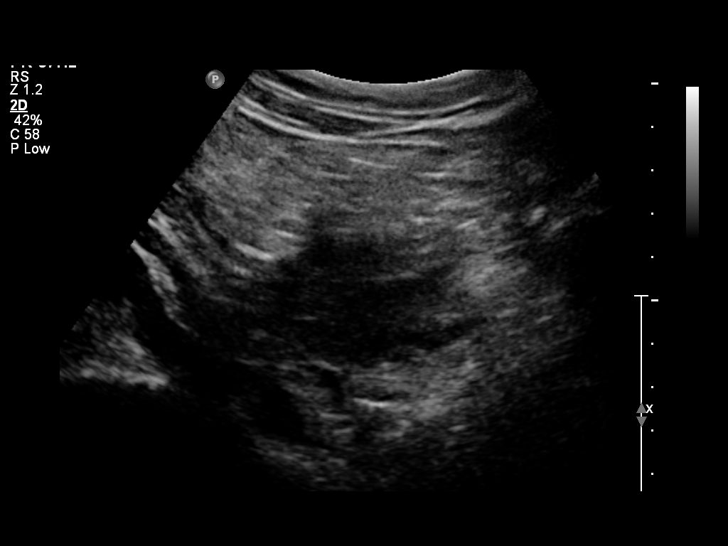
[im 25/74]
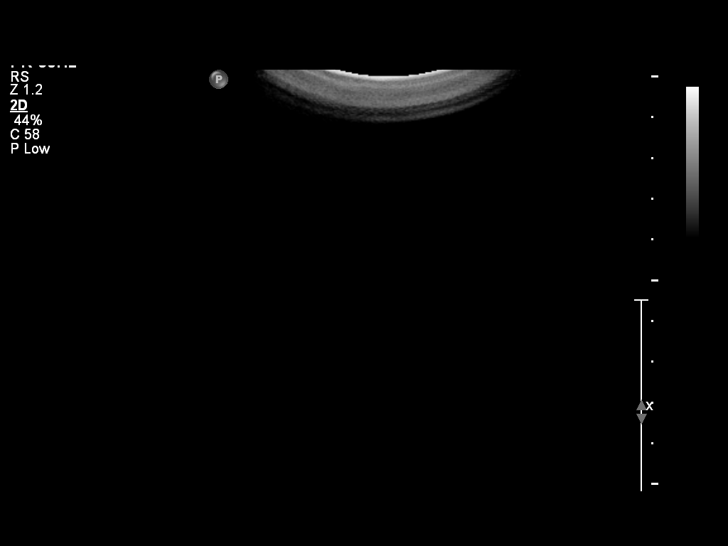
[im 30/74]
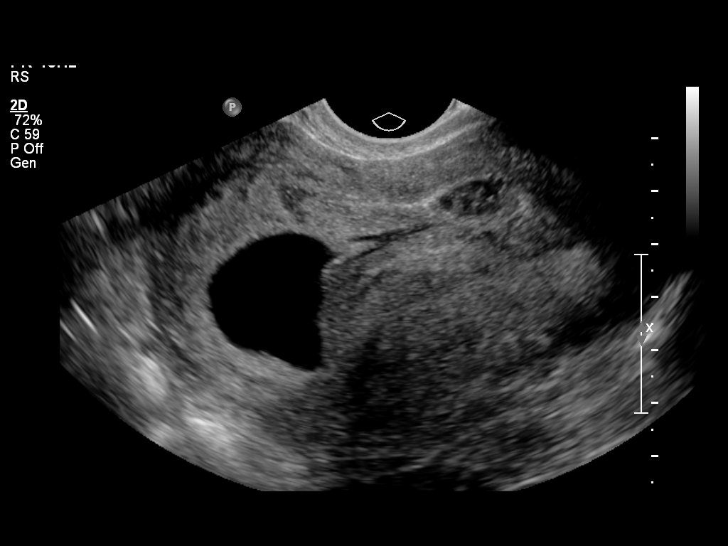
[im 36/74]
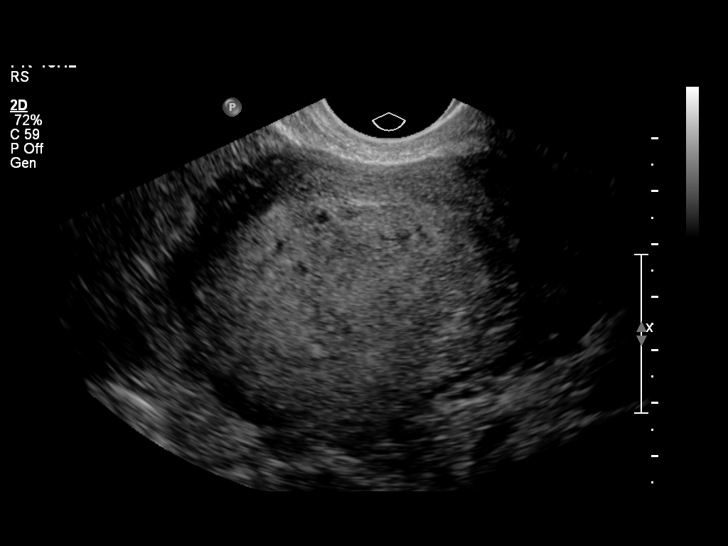
[im 41/74]
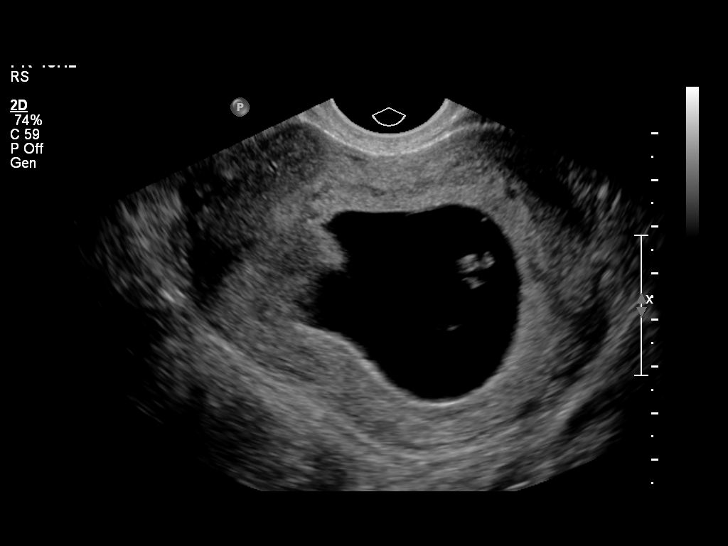
[im 46/74]
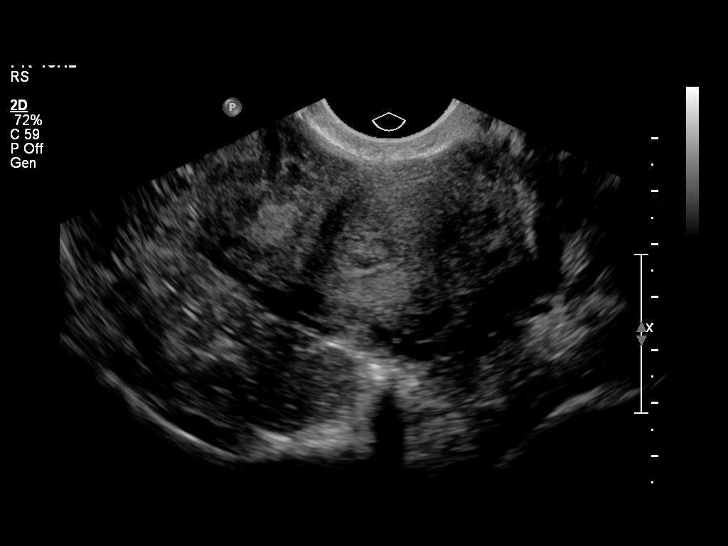
[im 52/74]
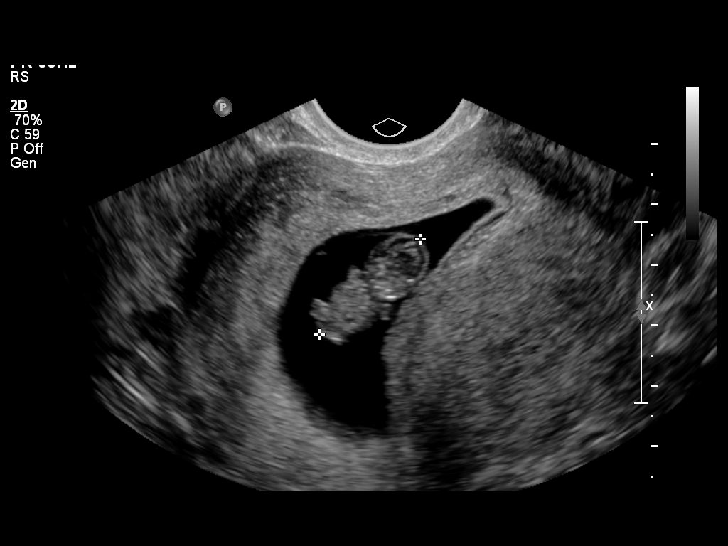
[im 57/74]
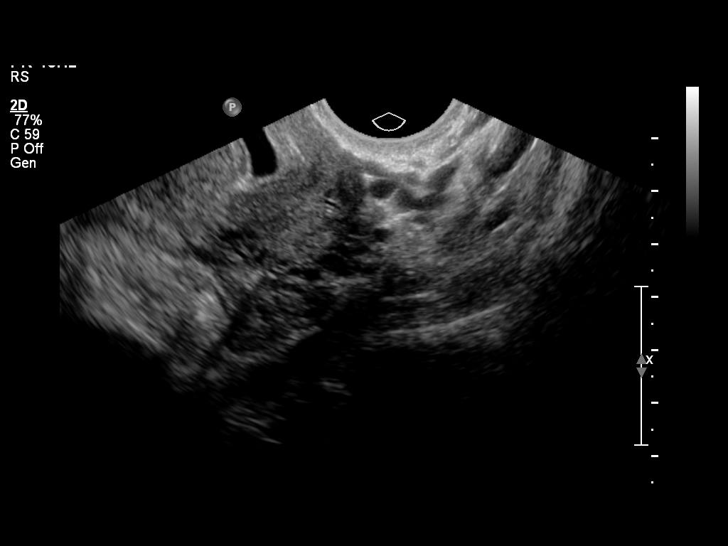
[im 63/74]
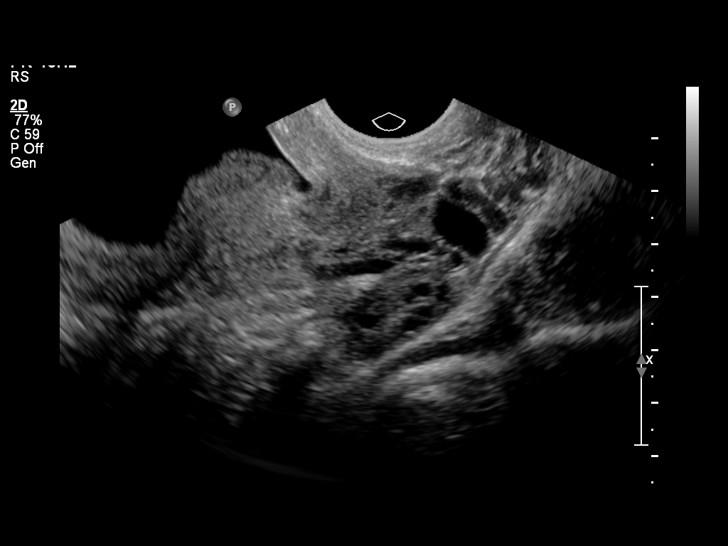
[im 68/74]
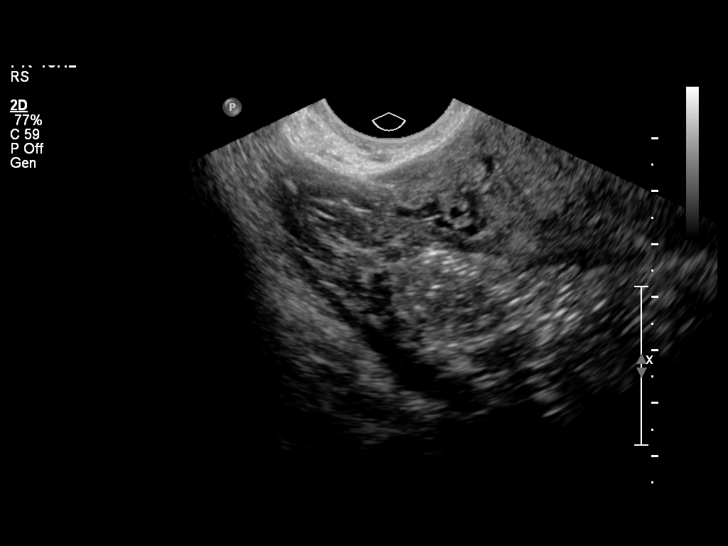
[im 74/74]
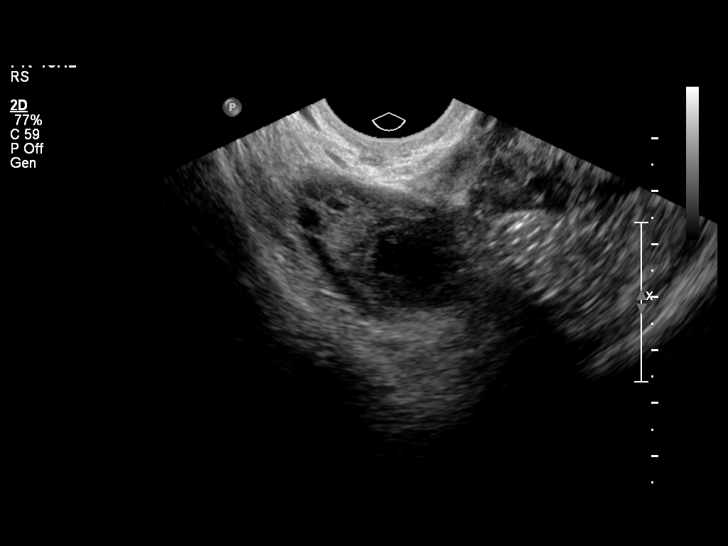

[14 of 28 positions shown; findings below may reference images not displayed]

FINDINGS: Intrauterine gestational sac: Visualized/normal in shape.

Yolk sac:  Present.

Embryo:  Present.

Cardiac Activity: Present.

Heart Rate:  170 bpm

CRL:   22.9  mm   9 w 0d                  US EDC: 02/04/2014

Maternal uterus/adnexae: No adnexal mass. Hypoechoic right ovarian
mass with peripheral flow consistent with a corpus luteum cyst.
Bilateral ovaries are otherwise normal. No pelvic free fluid.
IMPRESSION: Single live intrauterine pregnancy dating 9 weeks 0 days with a
current ultrasound EDC of 02/04/2014.

## 2015-12-27 ENCOUNTER — Encounter (HOSPITAL_COMMUNITY): Payer: Self-pay | Admitting: Emergency Medicine

## 2015-12-27 ENCOUNTER — Emergency Department (HOSPITAL_COMMUNITY)
Admission: EM | Admit: 2015-12-27 | Discharge: 2015-12-27 | Disposition: A | Discharge status: Home / Self Care | Payer: Medicaid Other | Attending: Emergency Medicine | Admitting: Emergency Medicine

## 2015-12-27 DIAGNOSIS — Z87891 Personal history of nicotine dependence: Secondary | ICD-10-CM | POA: Insufficient documentation

## 2015-12-27 DIAGNOSIS — J45909 Unspecified asthma, uncomplicated: Secondary | ICD-10-CM | POA: Insufficient documentation

## 2015-12-27 DIAGNOSIS — K529 Noninfective gastroenteritis and colitis, unspecified: Secondary | ICD-10-CM

## 2015-12-27 DIAGNOSIS — R112 Nausea with vomiting, unspecified: Secondary | ICD-10-CM | POA: Diagnosis present

## 2015-12-27 LAB — COMPREHENSIVE METABOLIC PANEL
ALBUMIN: 4.2 g/dL (ref 3.5–5.0)
ALT: 14 U/L (ref 14–54)
AST: 20 U/L (ref 15–41)
Alkaline Phosphatase: 47 U/L (ref 38–126)
Anion gap: 8 (ref 5–15)
BUN: 21 mg/dL — AB (ref 6–20)
CHLORIDE: 105 mmol/L (ref 101–111)
CO2: 24 mmol/L (ref 22–32)
CREATININE: 0.79 mg/dL (ref 0.44–1.00)
Calcium: 8.5 mg/dL — ABNORMAL LOW (ref 8.9–10.3)
GFR calc Af Amer: >60 mL/min (ref >60)
GFR calc non Af Amer: >60 mL/min (ref >60)
GLUCOSE: 118 mg/dL — AB (ref 65–99)
Potassium: 3.7 mmol/L (ref 3.5–5.1)
Sodium: 137 mmol/L (ref 135–145)
Total Bilirubin: 0.9 mg/dL (ref 0.3–1.2)
Total Protein: 7.9 g/dL (ref 6.5–8.1)

## 2015-12-27 LAB — URINALYSIS, ROUTINE W REFLEX MICROSCOPIC
BILIRUBIN URINE: NEGATIVE
GLUCOSE, UA: NEGATIVE mg/dL
Hgb urine dipstick: NEGATIVE
KETONES UR: 20 mg/dL — AB
LEUKOCYTES UA: NEGATIVE
Nitrite: NEGATIVE
PH: 5 (ref 5.0–8.0)
PROTEIN: NEGATIVE mg/dL
Specific Gravity, Urine: 1.035 — ABNORMAL HIGH (ref 1.005–1.030)

## 2015-12-27 LAB — PREGNANCY, URINE: Preg Test, Ur: NEGATIVE

## 2015-12-27 LAB — CBC
HEMATOCRIT: 36.9 % (ref 36.0–46.0)
Hemoglobin: 12.8 g/dL (ref 12.0–15.0)
MCH: 28.9 pg (ref 26.0–34.0)
MCHC: 34.7 g/dL (ref 30.0–36.0)
MCV: 83.3 fL (ref 78.0–100.0)
PLATELETS: 250 10*3/uL (ref 150–400)
RBC: 4.43 MIL/uL (ref 3.87–5.11)
RDW: 13.8 % (ref 11.5–15.5)
WBC: 7.2 10*3/uL (ref 4.0–10.5)

## 2015-12-27 LAB — LIPASE, BLOOD: Lipase: 17 U/L (ref 11–51)

## 2015-12-27 MED ORDER — ONDANSETRON HCL 4 MG/2ML IJ SOLN
4.0000 mg | Freq: Once | INTRAMUSCULAR | Status: AC
  Administered 2015-12-27: 4 mg via INTRAVENOUS
  Filled 2015-12-27: qty 2

## 2015-12-27 MED ORDER — SODIUM CHLORIDE 0.9 % IV BOLUS (SEPSIS)
1000.0000 mL | Freq: Once | INTRAVENOUS | Status: AC
  Administered 2015-12-27: 1000 mL via INTRAVENOUS

## 2015-12-27 MED ORDER — ONDANSETRON 8 MG PO TBDP: ORAL_TABLET | ORAL | 0 refills | Status: AC

## 2015-12-27 MED ORDER — KETOROLAC TROMETHAMINE 30 MG/ML IJ SOLN
30.0000 mg | Freq: Once | INTRAMUSCULAR | Status: AC
  Administered 2015-12-27: 30 mg via INTRAVENOUS
  Filled 2015-12-27: qty 1

## 2015-12-27 NOTE — ED Notes (Signed)
ED Provider at bedside. 

## 2015-12-27 NOTE — Discharge Instructions (Signed)
Clear liquid diet as tolerated for the next 12 hours, then slowly advance to normal.  Zofran as prescribed as needed for nausea.  Return to the emergency department if develop severe abdominal pain, bloody stools, or other new and concerning symptoms.

## 2015-12-27 NOTE — ED Notes (Signed)
Pt cannot use restroom at this time, aware urine specimen is needed.  

## 2015-12-27 NOTE — ED Notes (Signed)
Notified pt about urinalysis

## 2015-12-27 NOTE — ED Provider Notes (Signed)
[facility]-EMERGENCY DEPT Provider Note   CSN: [id number] Arrival date & time: 12/27/15  1112     History   Chief Complaint Chief Complaint  Patient presents with  . Emesis  . Back Pain    lower     HPI Carmen Pihshley K Mcgee is a 30 y.o. female.  Patient is a 30 year old female with no significant past medical history. She presents for evaluation of nausea, vomiting, diarrhea that began yesterday. She denies any fevers or chills. She denies any bloody vomit or stool. She reports her daughter was ill with similar symptoms recently.   The history is provided by the patient.  Emesis   This is a new problem. The current episode started yesterday. The problem occurs continuously. The problem has not changed since onset.There has been no fever. Associated symptoms include chills and diarrhea.    Past Medical History:  Diagnosis Date  . Asthma    childhood; allergies  . Gestational diabetes    diet controlled    Patient Active Problem List   Diagnosis Date Noted  . NSVD (normal spontaneous vaginal delivery) 01/21/2014  . Anemia 01/20/2014  . Gestational diabetes mellitus, class A1 01/15/2014  . Asthma 01/15/2014  . Hx of pyelonephritis 2008 01/15/2014    Past Surgical History:  Procedure Laterality Date  . BARTHOLIN GLAND CYST EXCISION    . TUBAL LIGATION Bilateral 01/21/2014   Procedure: POST PARTUM TUBAL LIGATION;  Surgeon: Konrad FelixEma Wakuru Kulwa, MD;  Location: [facility];  Service: Gynecology;  Laterality: Bilateral;    OB History    Gravida Para Term Preterm AB Living   7 3 3   4 3    SAB TAB Ectopic Multiple Live Births   2 2   0 3       Home Medications    Prior to Admission medications   Medication Sig Start Date End Date Taking? Authorizing Provider  ibuprofen (ADVIL,MOTRIN) 600 MG tablet Take 1 tablet (600 mg total) by mouth every 6 (six) hours as needed. Patient not taking: Reported on 12/27/2015 01/22/14   Nigel BridgemanVicki Latham, CNM  oxyCODONE-acetaminophen (PERCOCET/ROXICET)  5-325 MG per tablet Take 1 tablet by mouth every 4 (four) hours as needed (for pain scale less than 7). Patient not taking: Reported on 12/27/2015 01/22/14   Nigel BridgemanVicki Latham, CNM    Family History Family History  Problem Relation Age of Onset  . Hypertension Mother   . Migraines Mother   . Anemia Mother   . Diabetes Father   . Sleep apnea Father     Social History Social History  Substance Use Topics  . Smoking status: Former Smoker    Types: Cigarettes  . Smokeless tobacco: Never Used  . Alcohol use Yes     Comment: socially     Allergies   Patient has no known allergies.   Review of Systems Review of Systems  Constitutional: Positive for chills.  Gastrointestinal: Positive for diarrhea and vomiting.  All other systems reviewed and are negative.    Physical Exam Updated Vital Signs BP 105/67   Pulse 98   Temp 98.2 F (36.8 C) (Oral)   Resp 16   LMP 12/25/2015   SpO2 97%   Physical Exam  Constitutional: She is oriented to person, place, and time. She appears well-developed and well-nourished. No distress.  HENT:  Head: Normocephalic and atraumatic.  Mouth/Throat: Oropharynx is clear and moist. No oropharyngeal exudate.  Neck: Normal range of motion. Neck supple.  Cardiovascular: Normal rate and regular rhythm.  Exam reveals no gallop and no friction rub.   No murmur heard. Pulmonary/Chest: Effort normal and breath sounds normal. No respiratory distress. She has no wheezes.  Abdominal: Soft. Bowel sounds are normal. She exhibits no distension. There is no tenderness.  Musculoskeletal: Normal range of motion.  Neurological: She is alert and oriented to person, place, and time.  Skin: Skin is warm and dry. She is not diaphoretic.  Nursing note and vitals reviewed.    ED Treatments / Results  Labs (all labs ordered are listed, but only abnormal results are displayed) Labs Reviewed  COMPREHENSIVE METABOLIC PANEL - Abnormal; Notable for the following:        Result Value   Glucose, Bld 118 (*)    BUN 21 (*)    Calcium 8.5 (*)    All other components within normal limits  URINALYSIS, ROUTINE W REFLEX MICROSCOPIC - Abnormal; Notable for the following:    Specific Gravity, Urine 1.035 (*)    Ketones, ur 20 (*)    All other components within normal limits  LIPASE, BLOOD  CBC  PREGNANCY, URINE    EKG  EKG Interpretation None       Radiology No results found.  Procedures Procedures (including critical care time)  Medications Ordered in ED Medications  sodium chloride 0.9 % bolus 1,000 mL (0 mLs Intravenous Stopped 12/27/15 1234)  ondansetron (ZOFRAN) injection 4 mg (4 mg Intravenous Given 12/27/15 1140)  ketorolac (TORADOL) 30 MG/ML injection 30 mg (30 mg Intravenous Given 12/27/15 1140)     Initial Impression / Assessment and Plan / ED Course  I have reviewed the triage vital signs and the nursing notes.  Pertinent labs & imaging results that were available during my care of the patient were reviewed by me and considered in my medical decision making (see chart for details).  Clinical Course     Patient symptoms are likely viral gastroenteritis. Her abdomen is benign and laboratory studies are reassuring. She is feeling better after fluids and medications in the ER. She will be discharged with Zofran, clear liquids, and when necessary return.  Final Clinical Impressions(s) / ED Diagnoses   Final diagnoses:  None    New Prescriptions New Prescriptions   No medications on file     Geoffery Lyonsouglas Anita Laguna, MD 12/27/15 1413

## 2015-12-27 NOTE — ED Triage Notes (Addendum)
Pt reports emesis last night , lower back pain. denies urinary symptoms nor diarrhea. sts borderline diabetes , no meds

## 2015-12-27 NOTE — ED Notes (Signed)
Discharge instructions, follow up care, and rx x1 reviewed with patient. Patient verbalized understanding. 

## 2016-10-21 ENCOUNTER — Emergency Department (HOSPITAL_COMMUNITY)
Admission: EM | Admit: 2016-10-21 | Discharge: 2016-10-22 | Disposition: A | Discharge status: Home / Self Care | Payer: Medicaid Other | Attending: Emergency Medicine | Admitting: Emergency Medicine

## 2016-10-21 ENCOUNTER — Encounter (HOSPITAL_COMMUNITY): Payer: Self-pay | Admitting: Emergency Medicine

## 2016-10-21 DIAGNOSIS — M546 Pain in thoracic spine: Secondary | ICD-10-CM | POA: Diagnosis not present

## 2016-10-21 DIAGNOSIS — Z87891 Personal history of nicotine dependence: Secondary | ICD-10-CM | POA: Diagnosis not present

## 2016-10-21 DIAGNOSIS — J45909 Unspecified asthma, uncomplicated: Secondary | ICD-10-CM | POA: Diagnosis not present

## 2016-10-21 DIAGNOSIS — D649 Anemia, unspecified: Secondary | ICD-10-CM | POA: Diagnosis not present

## 2016-10-21 LAB — URINALYSIS, ROUTINE W REFLEX MICROSCOPIC
Bacteria, UA: NONE SEEN
Bilirubin Urine: NEGATIVE
Glucose, UA: NEGATIVE mg/dL
Ketones, ur: 5 mg/dL — AB
Leukocytes, UA: NEGATIVE
NITRITE: NEGATIVE
Protein, ur: NEGATIVE mg/dL
SPECIFIC GRAVITY, URINE: 1.024 (ref 1.005–1.030)
pH: 5 (ref 5.0–8.0)

## 2016-10-21 LAB — I-STAT CHEM 8, ED
BUN: 9 mg/dL (ref 6–20)
CHLORIDE: 106 mmol/L (ref 101–111)
Calcium, Ion: 1.12 mmol/L — ABNORMAL LOW (ref 1.15–1.40)
Creatinine, Ser: 0.7 mg/dL (ref 0.44–1.00)
Glucose, Bld: 89 mg/dL (ref 65–99)
HEMATOCRIT: 36 % (ref 36.0–46.0)
HEMOGLOBIN: 12.2 g/dL (ref 12.0–15.0)
POTASSIUM: 3.8 mmol/L (ref 3.5–5.1)
Sodium: 140 mmol/L (ref 135–145)
TCO2: 25 mmol/L (ref 22–32)

## 2016-10-21 LAB — PREGNANCY, URINE: Preg Test, Ur: NEGATIVE

## 2016-10-21 NOTE — ED Notes (Signed)
Bed: WA02 Expected date:  Expected time:  Means of arrival:  Comments: 

## 2016-10-21 NOTE — ED Triage Notes (Signed)
Pt reports having back pain in upper back that started last night. Pt reports having to lift a pt on Thursday and has been having pain since that day.

## 2016-10-22 LAB — D-DIMER, QUANTITATIVE: D-Dimer, Quant: <0.27 ug/mL-FEU (ref 0.00–0.50)

## 2016-10-22 MED ORDER — KETOROLAC TROMETHAMINE 30 MG/ML IJ SOLN
15.0000 mg | Freq: Once | INTRAMUSCULAR | Status: AC
  Administered 2016-10-22: 15 mg via INTRAVENOUS
  Filled 2016-10-22: qty 1

## 2016-10-22 MED ORDER — NAPROXEN 375 MG PO TABS: 375.0000 mg | ORAL_TABLET | Freq: Two times a day (BID) | ORAL | 0 refills | Status: AC

## 2016-10-22 MED ORDER — CYCLOBENZAPRINE HCL 10 MG PO TABS: 10.0000 mg | ORAL_TABLET | Freq: Two times a day (BID) | ORAL | 0 refills | Status: AC | PRN

## 2016-10-22 NOTE — Discharge Instructions (Signed)
No signs of blood clot. He did have some red blood cells and your urine. Does not show signs of infection. Need to have a repeat urine in 1-2 weeks by PCP to make sure this is clearing. This seems more musculoskeletal in nature.  Please take the Naproxen as prescribed for pain. Do not take any additional NSAIDs including Motrin, Aleve, Ibuprofen, Advil.  Please the the flexeril for muscle relaxation. This medication will make you drowsy so avoid situation that could place you in danger.   Warm compresses. Perform stretches. Follow-up with PCP.  Workup has been normal. Please take medications as prescribed and instructed.  SEEK IMMEDIATE MEDICAL ATTENTION IF: New numbness, tingling, weakness, or problem with the use of your arms or legs.  Severe back pain not relieved with medications.  Change in bowel or bladder control.  Urinary retention.  Numbness in your groin.  Increasing pain in any areas of the body (such as chest or abdominal pain).  Shortness of breath, dizziness or fainting.  Nausea (feeling sick to your stomach), vomiting, fever, or sweats.

## 2016-10-22 NOTE — ED Provider Notes (Signed)
[facility]-EMERGENCY DEPT Provider Note   CSN: [id number] Arrival date & time: 10/21/16  2108     History   Chief Complaint Chief Complaint  Patient presents with  . Back Pain    HPI Carmen Mcgee is a 31 y.o. female.  HPI 31 year old African-American female presents to the ED with complaints of upper back pain that started 4 days ago. 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 Patient has not tried anything for her symptoms prior to arrival. States the pain is worse with movement. Patient also states that when she takes a deep breath it hurts in her back. She denies any associated shortness of breath or chest pain.denies any associated urinary symptoms. Patient denies a history of PE/DVT, prolonged immobilizations or recent hospitalizations or surgeries, OCP use, tobacco use, unilateral leg swelling or calf tenderness. Denies any cardiac hx. Pt denies any ha, night sweats, hx of ivdu/cancer, loss or bowel or bladder, urinary retention, saddle paresthesias, lower extremity paresthesias.   Past Medical History:  Diagnosis Date  . Asthma    childhood; allergies  . Gestational diabetes    diet controlled    Patient Active Problem List   Diagnosis Date Noted  . NSVD (normal spontaneous vaginal delivery) 01/21/2014  . Anemia 01/20/2014  . Gestational diabetes mellitus, class A1 01/15/2014  . Asthma 01/15/2014  . Hx of pyelonephritis 2008 01/15/2014    Past Surgical History:  Procedure Laterality Date  . BARTHOLIN GLAND CYST EXCISION    . TUBAL LIGATION Bilateral 01/21/2014   Procedure: POST PARTUM TUBAL LIGATION;  Surgeon: Konrad Felix, MD;  Location: [facility];  Service: Gynecology;  Laterality: Bilateral;    OB History    Gravida Para Term Preterm AB Living   SAB TAB Ectopic Multiple Live Births   2 2   0 3       Home Medications    Prior to Admission medications     Medication Sig Start Date End Date Taking? Authorizing Provider  ibuprofen (ADVIL,MOTRIN) 600 MG tablet Take 1 tablet (600 mg total) by mouth every 6 (six) hours as needed. Patient not taking: Reported on 12/27/2015 01/22/14   Nigel Bridgeman, CNM  ondansetron [facility] ODT) 8 MG disintegrating tablet  ODT q4 hours prn nausea 12/27/15   Geoffery Lyons, MD  oxyCODONE-acetaminophen (PERCOCET/ROXICET) 5-325 MG per tablet Take 1 tablet by mouth every 4 (four) hours as needed (for pain scale less than 7). Patient not taking: Reported on 12/27/2015 01/22/14   Nigel Bridgeman, CNM    Family History Family History  Problem Relation Age of Onset  . Hypertension Mother   . Migraines Mother   . Anemia Mother   . Diabetes Father   . Sleep apnea Father     Social History Social History  Substance Use Topics  . Smoking status: Former Smoker    Types: Cigarettes  . Smokeless tobacco: Never Used  . Alcohol use Yes     Comment: socially     Allergies   Patient has no known allergies.   Review of Systems Review of Systems  Constitutional: Negative for chills and fever.  Gastrointestinal: Negative for nausea.  Genitourinary: Negative for dysuria, flank pain, frequency, hematuria and urgency.  Musculoskeletal: Positive for arthralgias, back pain and myalgias.  Neurological: Negative for weakness and numbness.     Physical Exam Updated Vital Signs BP  114/79 (BP Location: Left Arm)   Pulse 85   Temp 98.2 F (36.8 C) (Oral)   Resp 18   Ht  (1.676 m)   Wt 63.5 kg (140 lb)   LMP 10/21/2016 (Exact Date)   SpO2 99%   BMI 22.60 kg/m   Physical Exam  Constitutional: She is oriented to person, place, and time. She appears well-developed and well-nourished. No distress.  HENT:  Head: Normocephalic and atraumatic.  Eyes: Right eye exhibits no discharge. Left eye exhibits no discharge. No scleral icterus.  Neck: Normal range of motion. Neck supple.  No c spine midline tenderness. No  paraspinal tenderness. No deformities or step offs noted. Full ROM. Supple. No nuchal rigidity.    Cardiovascular: Normal rate, regular rhythm, normal heart sounds and intact distal pulses.   Pulmonary/Chest: Effort normal and breath sounds normal. No respiratory distress. She has no wheezes. She has no rales. She exhibits no tenderness.  Musculoskeletal: Normal range of motion.  No midline T spine or L spine tenderness. No deformities or step offs noted. Full ROM. Pelvis is stable.  Patient with thoracic paraspinal tenderness. Worse with palpation. Tense musculature noted.  Neurological: She is alert and oriented to person, place, and time.  Strength 5/5 in lower extremities. Sensation intact.   Skin: Skin is warm and dry. Capillary refill takes less than 2 seconds. No pallor.  Psychiatric: Her behavior is normal. Judgment and thought content normal.  Nursing note and vitals reviewed.    ED Treatments / Results  Labs (all labs ordered are listed, but only abnormal results are displayed) Labs Reviewed  URINALYSIS, ROUTINE W REFLEX MICROSCOPIC - Abnormal; Notable for the following:       Result Value   Hgb urine dipstick MODERATE (*)    Ketones, ur 5 (*)    Squamous Epithelial / LPF 0-5 (*)    All other components within normal limits  I-STAT CHEM 8, ED - Abnormal; Notable for the following:    Calcium, Ion 1.12 (*)    All other components within normal limits  PREGNANCY, URINE  D-DIMER, QUANTITATIVE (NOT AT [facility])    EKG  EKG Interpretation None       Radiology No results found.  Procedures Procedures (including critical care time)  Medications Ordered in ED Medications  ketorolac (TORADOL) 30 MG/ML injection 15 mg (15 mg Intravenous Given 10/22/16 0030)     Initial Impression / Assessment and Plan / ED Course  I have reviewed the triage vital signs and the nursing notes.  Pertinent labs & imaging results that were available during my care of the patient were  reviewed by me and considered in my medical decision making (see chart for details).     Patient presents to the ED with complaints of upper back pain. Worse with palpation and movement. Worse after lifting a patient on Thursday last week. Patient has no red flag symptoms. Patient has no focal neuro deficit on exam. Denies any urinary symptoms.UA with moderate hemoglobin but no other signs of infection. Unknown etiology. Encouraged patient to have urine rechecked in 1 week by PCP. Kidney function normal. Patient does complain of some inspiratory pain in her back from breathing. She is low risk for PE. However pt would like a d dimer. A d-dimer was ordered and was negative. Pt is perc negative and vs ar reassuring. The patient denies any chest pain or shortness of breath. Low suspicion for ACS. Pain seems consistent with musculoskeletal pain. We will give  him muscle relaxer's and anti-inflammatory.  Pt is hemodynamically stable, in NAD, & able to ambulate in the ED. Evaluation does not show pathology that would require ongoing emergent intervention or inpatient treatment. I explained the diagnosis to the patient. Pain has been managed & has no complaints prior to dc. Pt is comfortable with above plan and is stable for discharge at this time. All questions were answered prior to disposition. Strict return precautions for f/u to the ED were discussed. Encouraged follow up with PCP.   Final Clinical Impressions(s) / ED Diagnoses   Final diagnoses:  Acute bilateral thoracic back pain    New Prescriptions New Prescriptions   CYCLOBENZAPRINE (FLEXERIL) 10 MG TABLET    Take 1 tablet (10 mg total) by mouth 2 (two) times daily as needed for muscle spasms.   NAPROXEN (NAPROSYN) 375 MG TABLET    Take 1 tablet (375 mg total) by mouth 2 (two) times daily.     Rise Mu, PA-C 10/22/16 Havery Moros, MD 10/23/16 [phone]

## 2017-06-06 ENCOUNTER — Encounter (HOSPITAL_COMMUNITY): Payer: Self-pay | Admitting: *Deleted

## 2017-06-06 ENCOUNTER — Emergency Department (HOSPITAL_COMMUNITY)
Admission: EM | Admit: 2017-06-06 | Discharge: 2017-06-06 | Disposition: A | Discharge status: Home / Self Care | Payer: Medicaid Other | Attending: Emergency Medicine | Admitting: Emergency Medicine

## 2017-06-06 DIAGNOSIS — L299 Pruritus, unspecified: Secondary | ICD-10-CM | POA: Diagnosis present

## 2017-06-06 DIAGNOSIS — Z87891 Personal history of nicotine dependence: Secondary | ICD-10-CM | POA: Insufficient documentation

## 2017-06-06 DIAGNOSIS — J45909 Unspecified asthma, uncomplicated: Secondary | ICD-10-CM | POA: Diagnosis not present

## 2017-06-06 DIAGNOSIS — T7840XA Allergy, unspecified, initial encounter: Secondary | ICD-10-CM

## 2017-06-06 MED ORDER — PREDNISONE 50 MG PO TABS: ORAL_TABLET | ORAL | 0 refills | Status: AC

## 2017-06-06 NOTE — ED Triage Notes (Signed)
Pt complains of facial itching, swelling around eye, dry mouth since this morning. Pt believes she may be having allergic reaction. Pt denies hx of allergies, denies any new cosmetics. Pt denies throat swelling.

## 2017-06-06 NOTE — ED Provider Notes (Signed)
[facility]-EMERGENCY DEPT Provider Note   CSN: [id number] Arrival date & time: 06/06/17  4098     History   Chief Complaint Chief Complaint  Patient presents with  . Facial Swelling  . Pruritis    face    HPI Carmen Mcgee is a 32 y.o. female.  32 year old female presents with pruritus around her lips x2 days.  Concern is to be having an allergic reaction.  He is Benadryl x1 without relief.  Denies any whole body itching.  No new chemicals being used.  No trouble swallowing or oral lesions noted.  No prior history of same.     Past Medical History:  Diagnosis Date  . Asthma    childhood; allergies  . Gestational diabetes    diet controlled    Patient Active Problem List   Diagnosis Date Noted  . NSVD (normal spontaneous vaginal delivery) 01/21/2014  . Anemia 01/20/2014  . Gestational diabetes mellitus, class A1 01/15/2014  . Asthma 01/15/2014  . Hx of pyelonephritis 2008 01/15/2014    Past Surgical History:  Procedure Laterality Date  . BARTHOLIN GLAND CYST EXCISION    . TUBAL LIGATION Bilateral 01/21/2014   Procedure: POST PARTUM TUBAL LIGATION;  Surgeon: Konrad Felix, MD;  Location: [facility];  Service: Gynecology;  Laterality: Bilateral;     OB History    Gravida  7   Para  3   Term  3   Preterm      AB  4   Living  3     SAB  2   TAB  2   Ectopic      Multiple  0   Live Births  3            Home Medications    Prior to Admission medications   Medication Sig Start Date End Date Taking? Authorizing Provider  cyclobenzaprine (FLEXERIL) 10 MG tablet Take 1 tablet (10 mg total) by mouth 2 (two) times daily as needed for muscle spasms. 10/22/16   Rise Mu, PA-C  ibuprofen (ADVIL,MOTRIN) 600 MG tablet Take 1 tablet (600 mg total) by mouth every 6 (six) hours as needed. Patient not taking: Reported on 12/27/2015 01/22/14   Nigel Bridgeman, CNM  naproxen (NAPROSYN) 375 MG tablet Take 1 tablet (375 mg  total) by mouth 2 (two) times daily. 10/22/16   Rise Mu, PA-C  ondansetron (ZOFRAN ODT) 8 MG disintegrating tablet  ODT q4 hours prn nausea 12/27/15   Geoffery Lyons, MD  oxyCODONE-acetaminophen (PERCOCET/ROXICET) 5-325 MG per tablet Take 1 tablet by mouth every 4 (four) hours as needed (for pain scale less than 7). Patient not taking: Reported on 12/27/2015 01/22/14   Nigel Bridgeman, CNM    Family History Family History  Problem Relation Age of Onset  . Hypertension Mother   . Migraines Mother   . Anemia Mother   . Diabetes Father   . Sleep apnea Father     Social History Social History   Tobacco Use  . Smoking status: Former Smoker    Types: Cigarettes  . Smokeless tobacco: Never Used  Substance Use Topics  . Alcohol use: Yes    Comment: socially  . Drug use: No     Allergies   Patient has no known allergies.   Review of Systems Review of Systems  All other systems reviewed and are negative.    Physical Exam Updated Vital [id number] 138/87 (BP Location: Left Arm)   Pulse 75  Temp 98.5 F (36.9 C) (Oral)   Resp 16   Ht 1.676 m ( )   Wt 59 kg (130 lb)   LMP 05/07/2017   SpO2 99%   BMI 20.98 kg/m   Physical Exam  Constitutional: She is oriented to person, place, and time. She appears well-developed and well-nourished.  Non-toxic appearance. No distress.  HENT:  Head: Normocephalic and atraumatic.  Mouth/Throat:    Eyes: Pupils are equal, round, and reactive to light. Conjunctivae, EOM and lids are normal.  Neck: Normal range of motion. Neck supple. No tracheal deviation present. No thyroid mass present.  Cardiovascular: Normal rate, regular rhythm and normal heart sounds. Exam reveals no gallop.  No murmur heard. Pulmonary/Chest: Effort normal and breath sounds normal. No stridor. No respiratory distress. She has no decreased breath sounds. She has no wheezes. She has no rhonchi. She has no rales.  Abdominal: Soft. Normal appearance and  bowel sounds are normal. She exhibits no distension. There is no tenderness. There is no rebound and no CVA tenderness.  Musculoskeletal: Normal range of motion. She exhibits no edema or tenderness.  Neurological: She is alert and oriented to person, place, and time. She has normal strength. No cranial nerve deficit or sensory deficit. GCS eye subscore is 4. GCS verbal subscore is 5. GCS motor subscore is 6.  Skin: Skin is warm and dry. No abrasion and no rash noted.  Psychiatric: She has a normal mood and affect. Her speech is normal and behavior is normal.  Nursing note and vitals reviewed.    ED Treatments / Results  Labs (all labs ordered are listed, but only abnormal results are displayed) Labs Reviewed - No data to display  EKG None  Radiology No results found.  Procedures Procedures (including critical care time)  Medications Ordered in ED Medications - No data to display   Initial Impression / Assessment and Plan / ED Course  I have reviewed the triage vital signs and the nursing notes.  Pertinent labs & imaging results that were available during my care of the patient were reviewed by me and considered in my medical decision making (see chart for details).     Patient with subjective allergic reaction.  Will place on 3 days of prednisone and she will take Claritin as needed  Final Clinical Impressions(s) / ED Diagnoses   Final diagnoses:  None    ED Discharge Orders    None       Lorre Nick, MD 06/06/17 1146

## 2021-09-19 ENCOUNTER — Ambulatory Visit (INDEPENDENT_AMBULATORY_CARE_PROVIDER_SITE_OTHER): Payer: Medicaid Other | Admitting: Orthopaedic Surgery

## 2021-09-19 ENCOUNTER — Ambulatory Visit (INDEPENDENT_AMBULATORY_CARE_PROVIDER_SITE_OTHER): Payer: Medicaid Other

## 2021-09-19 DIAGNOSIS — M67432 Ganglion, left wrist: Secondary | ICD-10-CM

## 2021-09-19 DIAGNOSIS — M25532 Pain in left wrist: Secondary | ICD-10-CM

## 2021-09-19 NOTE — Progress Notes (Signed)
   Office Visit Note   Patient: Carmen Mcgee           Date of Birth: 09/03/1985           MRN: [id number] Visit Date: 09/19/2021              Requested by: No referring provider defined for this encounter. PCP: No primary care provider on file.   Assessment & Plan: Visit Diagnoses:  1. Ganglion cyst of dorsum of left wrist     Plan: Impression is left wrist dorsal ganglion cyst.  Today, we discussed various treatment options to include aspiration versus surgical excision.  Because the cyst is relatively small and relatively mildly symptomatic, we have recommended aspiration and cortisone injection for now.  We have referred her to Dr. Shon Baton to have this done under ultrasound.  She will follow-up with [place] as needed.  Follow-Up Instructions: Return if symptoms worsen or fail to improve.   Orders:  Orders Placed This Encounter  Procedures   XR Wrist 2 Views Left   No orders of the defined types were placed in this encounter.     Procedures: No procedures performed   Clinical Data: No additional findings.   Subjective: Chief Complaint  Patient presents with   Left Wrist - Pain    HPI patient is a pleasant 36 year old right-hand-dominant female who comes in today with concerns about a cyst to the left wrist.  She noticed this about 2 years ago.  She denies any specific injury or change in activity.  The cyst would initially fluctuate in size but has recently become bigger.  Symptoms are worse with wrist flexion.  She has noticed slight paresthesias to the ring and small fingers.  She denies any fevers or chills or any other constitutional symptoms.  Review of Systems as detailed in HPI.  All others reviewed and are negative.   Objective: Vital Signs: There were no vitals taken for this visit.  Physical Exam well-developed well-nourished female in no acute distress.  Alert and oriented x3.  Ortho Exam left wrist exam reveals a small ganglion cyst to the dorsum of the  wrist.  She is nontender.  There are no skin changes.  She does have increased pain with wrist flexion.  She is neurovascular intact distally.  Specialty Comments:  No specialty comments available.  Imaging: XR Wrist 2 Views Left  Result Date: 09/19/2021 No acute or structural abnormalities    PMFS History: There are no problems to display for this patient.  No past medical history on file.  No family history on file.   Social History   Occupational History   Not on file  Tobacco Use   Smoking status: Not on file   Smokeless tobacco: Not on file  Substance and Sexual Activity   Alcohol use: Not on file   Drug use: Not on file   Sexual activity: Not on file

## 2021-09-22 ENCOUNTER — Ambulatory Visit: Payer: Self-pay

## 2021-09-22 ENCOUNTER — Ambulatory Visit (INDEPENDENT_AMBULATORY_CARE_PROVIDER_SITE_OTHER): Payer: Medicaid Other | Admitting: Sports Medicine

## 2021-09-22 ENCOUNTER — Encounter: Payer: Self-pay | Admitting: Sports Medicine

## 2021-09-22 VITALS — BP 114/73 | HR 71 | Ht 67.0 in | Wt 127.0 lb

## 2021-09-22 DIAGNOSIS — M25532 Pain in left wrist: Secondary | ICD-10-CM

## 2021-09-22 DIAGNOSIS — M67432 Ganglion, left wrist: Secondary | ICD-10-CM

## 2021-09-22 NOTE — Progress Notes (Signed)
   Procedure Note  Patient: Carmen Mcgee             Date of Birth: [birth year]           MRN: [id number]             Visit Date: 09/22/2021  Procedures: Visit Diagnoses:  1. Ganglion cyst of dorsum of left wrist   2. Pain in left wrist    *Imaging - [facility] Limited dorsal wrist ultrasound performed, left:  Short axis evaluation of Lister's tubercle with second and third dorsal compartments visualized without notable tendon irregularity.  Evaluation of the dorsal wrist does note a hypoechoic collection approximately 0.6 x 0.22 centimeters in diameter overlying the radiocarpal joint.  There is no increased Doppler flow or hyperemia noted here.  Further evaluation of the cyst demonstrates a septation with a smaller hypoechoic fluid collection just inferior to this.  Static ultrasound do show appropriate needle visualization into the cystic area.   IMPRESSION: Dorsal hypoechoic fluid collection, indicative of likely ganglion cyst.  Technically successful aspiration and subsequent injection.    Procedure: US-guided Dorsal Ganglion Cyst Injection, left Wrist After informed verbal consent and discussion on risks/benefits/indications, a timeout was performed. The patient was seated on exam table with hand and wrist placed on procedure table. Area overlying the patient's dorsal wrist joint and ganglion cyst prepped with Betadine and alcohol swabs. Then utilizing ultrasound guidance, the patient's ganglion cyst was identified in a short axis view. The region was first anesthesized with 2cc of lidocaine 1% plain. Using ultrasound guidance with an in-plane approach the ganglion cyst was aspirated with a 18-gauge, 1.5" needle yielding approximately 0.5cc of a gelatinous material. Following aspiration, the cyst remnant was subsequently injected with 0.5cc of betamethasone using the same portal. Patient tolerated procedure well without immediate complications. Area was covered with a band-aid and then wrapped with  compressive wrap.   -Routine post aspiration ganglion cyst instruction provided for the patient.  Did recommend she continue wearing the wrist brace for the next 2-3 days some gentle compression.  After that time period, she may return to activity as her pain allows. -Follow-up with Dr. Roda Shutters as indicated; I am happy to see her back as needed  Madelyn Brunner, DO Primary Care Sports Medicine Physician  [facility]  This note was dictated using Dragon naturally speaking software and may contain errors in syntax, spelling, or content which have not been identified prior to signing this note.

## 2022-02-08 ENCOUNTER — Encounter (HOSPITAL_COMMUNITY): Payer: Self-pay | Admitting: *Deleted

## 2023-03-14 ENCOUNTER — Ambulatory Visit: Payer: Self-pay | Admitting: Podiatry

## 2023-07-01 ENCOUNTER — Ambulatory Visit: Payer: Self-pay
